# Patient Record
Sex: Female | Born: 1990 | Race: White | Hispanic: Yes | Marital: Married | Smoking: Former smoker
Health system: Southern US, Community
[De-identification: ages and names within clinical notes are randomized; demographics above are authoritative.]

## PROBLEM LIST (undated history)

## (undated) DIAGNOSIS — Z789 Other specified health status: Secondary | ICD-10-CM

## (undated) DIAGNOSIS — B9689 Other specified bacterial agents as the cause of diseases classified elsewhere: Secondary | ICD-10-CM

## (undated) DIAGNOSIS — N390 Urinary tract infection, site not specified: Secondary | ICD-10-CM

## (undated) DIAGNOSIS — R569 Unspecified convulsions: Secondary | ICD-10-CM

## (undated) DIAGNOSIS — F419 Anxiety disorder, unspecified: Secondary | ICD-10-CM

## (undated) DIAGNOSIS — F32A Depression, unspecified: Secondary | ICD-10-CM

## (undated) DIAGNOSIS — Z3480 Encounter for supervision of other normal pregnancy, unspecified trimester: Secondary | ICD-10-CM

## (undated) DIAGNOSIS — N760 Acute vaginitis: Secondary | ICD-10-CM

## (undated) HISTORY — DX: Other specified health status: Z78.9

## (undated) HISTORY — PX: OTHER SURGICAL HISTORY: SHX169

## (undated) HISTORY — PX: DILATION AND CURETTAGE, DIAGNOSTIC / THERAPEUTIC: SUR384

## (undated) HISTORY — DX: Encounter for supervision of other normal pregnancy, unspecified trimester: Z34.80

## (undated) HISTORY — DX: Depression, unspecified: F32.A

## (undated) HISTORY — PX: TUMOR REMOVAL: SHX12

## (undated) HISTORY — DX: Anxiety disorder, unspecified: F41.9

---
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
## 2011-02-17 LAB — TYPE AND SCREEN

## 2011-02-17 LAB — URINALYSIS, ROUTINE W REFLEX MICROSCOPIC
Bilirubin Urine: NEGATIVE
Ketones, ur: NEGATIVE
Nitrite: NEGATIVE
Protein, ur: NEGATIVE
pH: 6

## 2011-02-17 LAB — ABO/RH: ABO/RH(D): O POS

## 2011-04-15 ENCOUNTER — Emergency Department (HOSPITAL_COMMUNITY)
Admission: EM | Admit: 2011-04-15 | Discharge: 2011-04-15 | Disposition: A | Discharge status: Home / Self Care | Payer: Self-pay | Attending: Emergency Medicine | Admitting: Emergency Medicine

## 2011-04-15 ENCOUNTER — Encounter: Payer: Self-pay | Admitting: Emergency Medicine

## 2011-04-15 DIAGNOSIS — A499 Bacterial infection, unspecified: Secondary | ICD-10-CM | POA: Insufficient documentation

## 2011-04-15 DIAGNOSIS — N760 Acute vaginitis: Secondary | ICD-10-CM | POA: Insufficient documentation

## 2011-04-15 DIAGNOSIS — J45909 Unspecified asthma, uncomplicated: Secondary | ICD-10-CM | POA: Insufficient documentation

## 2011-04-15 DIAGNOSIS — F17200 Nicotine dependence, unspecified, uncomplicated: Secondary | ICD-10-CM | POA: Insufficient documentation

## 2011-04-15 DIAGNOSIS — R300 Dysuria: Secondary | ICD-10-CM | POA: Insufficient documentation

## 2011-04-15 DIAGNOSIS — B9689 Other specified bacterial agents as the cause of diseases classified elsewhere: Secondary | ICD-10-CM | POA: Insufficient documentation

## 2011-04-15 DIAGNOSIS — N72 Inflammatory disease of cervix uteri: Secondary | ICD-10-CM | POA: Insufficient documentation

## 2011-04-15 LAB — URINALYSIS, ROUTINE W REFLEX MICROSCOPIC
Bilirubin Urine: NEGATIVE
Glucose, UA: NEGATIVE mg/dL
Ketones, ur: 15 mg/dL — AB
Protein, ur: 30 mg/dL — AB
pH: 5.5 (ref 5.0–8.0)

## 2011-04-15 LAB — WET PREP, GENITAL
Trich, Wet Prep: NONE SEEN
Yeast Wet Prep HPF POC: NONE SEEN

## 2011-04-15 LAB — URINE MICROSCOPIC-ADD ON

## 2011-04-15 MED ORDER — CEFTRIAXONE SODIUM 250 MG IJ SOLR
250.0000 mg | Freq: Once | INTRAMUSCULAR | Status: AC
  Administered 2011-04-15: 250 mg via INTRAMUSCULAR
  Filled 2011-04-15: qty 250

## 2011-04-15 MED ORDER — AZITHROMYCIN 250 MG PO TABS
500.0000 mg | ORAL_TABLET | Freq: Once | ORAL | Status: AC
  Administered 2011-04-15: 500 mg via ORAL
  Filled 2011-04-15: qty 2

## 2011-04-15 MED ORDER — LIDOCAINE HCL (PF) 1 % IJ SOLN
INTRAMUSCULAR | Status: AC
  Administered 2011-04-15: 5 mL
  Filled 2011-04-15: qty 5

## 2011-04-15 MED ORDER — METRONIDAZOLE 500 MG PO TABS: 500.0000 mg | ORAL_TABLET | Freq: Two times a day (BID) | ORAL | Status: AC

## 2011-04-15 MED ORDER — TRAMADOL HCL 50 MG PO TABS
50.0000 mg | ORAL_TABLET | Freq: Four times a day (QID) | ORAL | Status: AC | PRN
Start: 2011-04-15 — End: 2011-04-25

## 2011-04-15 NOTE — ED Notes (Signed)
PT ambulated with a steady gait; VSS; no signs of distress; skin warm and dry; respirations even and unlabored; no questions.

## 2011-04-15 NOTE — ED Provider Notes (Signed)
History     CSN: [id number] Arrival date & time: 04/15/2011  2:30 AM   First MD Initiated Contact with Patient 04/15/11 [phone]      Chief Complaint  Patient presents with  . Vaginal Discharge    (Consider location/radiation/quality/duration/timing/severity/associated sxs/prior treatment) Patient is a 20 y.o. female presenting with vaginal discharge. The history is provided by the patient. No language interpreter was used.  Vaginal Discharge This is a new problem. The current episode started more than 2 days ago. The problem occurs constantly. The problem has not changed since onset.Pertinent negatives include no chest pain, no abdominal pain, no headaches and no shortness of breath. The symptoms are aggravated by nothing. The symptoms are relieved by nothing. She has tried nothing for the symptoms. The treatment provided no relief.    Past Medical History  Diagnosis Date  . Asthma     History reviewed. No pertinent past surgical history.  No family history on file.  History  Substance Use Topics  . Smoking status: Current Everyday Smoker  . Smokeless tobacco: Not on file  . Alcohol Use: No    OB History    Grav Para Term Preterm Abortions TAB SAB Ect Mult Living                  Review of Systems  Constitutional: Negative for activity change.  HENT: Negative for facial swelling.   Eyes: Negative for discharge.  Respiratory: Negative for apnea and shortness of breath.   Cardiovascular: Negative for chest pain.  Gastrointestinal: Negative for abdominal pain and abdominal distention.  Genitourinary: Positive for dysuria and vaginal discharge. Negative for vaginal bleeding.  Musculoskeletal: Negative.   Neurological: Negative for headaches.  Hematological: Negative.   Psychiatric/Behavioral: Negative.     Allergies  Review of patient's allergies indicates no known allergies.  Home Medications  No current outpatient prescriptions on file.  BP 112/72  Pulse 103   Temp(Src) 99.1 F (37.3 C) (Oral)  Resp 18  SpO2 99%  LMP 04/07/2011  Physical Exam  Constitutional: She is oriented to person, place, and time. She appears well-developed and well-nourished.  HENT:  Head: Normocephalic and atraumatic.  Eyes: EOM are normal. Pupils are equal, round, and reactive to light.  Neck: Normal range of motion. Neck supple. No tracheal deviation present.  Cardiovascular: Normal rate and regular rhythm.   Pulmonary/Chest: Effort normal and breath sounds normal. She has no wheezes. She has no rales.  Abdominal: Soft. Bowel sounds are normal. There is no tenderness. There is no rebound and no guarding.  Genitourinary: Cervix exhibits motion tenderness. Right adnexum displays no tenderness. Left adnexum displays tenderness.  Musculoskeletal: Normal range of motion.  Neurological: She is alert and oriented to person, place, and time.  Skin: Skin is warm and dry.  Psychiatric: Thought content normal.    ED Course  Procedures (including critical care time)  Labs Reviewed  WET PREP, GENITAL - Abnormal; Notable for the following:    Clue Cells, Wet Prep MODERATE (*)    WBC, Wet Prep HPF POC MODERATE (*)    All other components within normal limits  URINALYSIS, ROUTINE W REFLEX MICROSCOPIC - Abnormal; Notable for the following:    Specific Gravity, Urine 1.041 (*)    Hgb urine dipstick SMALL (*)    Ketones, ur 15 (*)    Protein, ur 30 (*)    Leukocytes, UA MODERATE (*)    All other components within normal limits  URINE MICROSCOPIC-ADD ON - Abnormal; Notable  for the following:    Squamous Epithelial / LPF FEW (*)    All other components within normal limits  POCT PREGNANCY, URINE  GC/CHLAMYDIA PROBE AMP, GENITAL  POCT PREGNANCY, URINE   No results found.   No diagnosis found.    MDM  No sexual activity until 7 days after your partner has been treated.  Take all medications.  Return for worsening symptoms.  Pap smear by your regular provider.   Recheck in 7 days.  Patient verbalizes understanding and agrees to follow up        Terrianne Cavness Smitty Cords, MD 04/15/11 0454

## 2011-04-15 NOTE — ED Notes (Signed)
PT. REPORTS VAGINAL DISCHARGE WITH ITCHING AND IRRITATION FOR 2 DAYS .

## 2012-01-26 ENCOUNTER — Emergency Department (HOSPITAL_COMMUNITY): Payer: Self-pay

## 2012-01-26 ENCOUNTER — Encounter (HOSPITAL_COMMUNITY): Payer: Self-pay

## 2012-01-26 ENCOUNTER — Emergency Department (HOSPITAL_COMMUNITY)
Admission: EM | Admit: 2012-01-26 | Discharge: 2012-01-27 | Disposition: A | Discharge status: Home / Self Care | Payer: Self-pay | Attending: Emergency Medicine | Admitting: Emergency Medicine

## 2012-01-26 DIAGNOSIS — N730 Acute parametritis and pelvic cellulitis: Secondary | ICD-10-CM | POA: Insufficient documentation

## 2012-01-26 HISTORY — DX: Other specified bacterial agents as the cause of diseases classified elsewhere: B96.89

## 2012-01-26 HISTORY — DX: Urinary tract infection, site not specified: N39.0

## 2012-01-26 HISTORY — DX: Other specified bacterial agents as the cause of diseases classified elsewhere: N76.0

## 2012-01-26 LAB — WET PREP, GENITAL
Trich, Wet Prep: NONE SEEN
Yeast Wet Prep HPF POC: NONE SEEN

## 2012-01-26 MED ORDER — IBUPROFEN 800 MG PO TABS
800.0000 mg | ORAL_TABLET | Freq: Once | ORAL | Status: AC
  Administered 2012-01-27: 800 mg via ORAL
  Filled 2012-01-26: qty 1

## 2012-01-26 MED ORDER — KETOROLAC TROMETHAMINE 60 MG/2ML IM SOLN
60.0000 mg | Freq: Once | INTRAMUSCULAR | Status: DC
  Filled 2012-01-26: qty 2

## 2012-01-26 NOTE — ED Notes (Signed)
PA at bedside for pelvic

## 2012-01-26 NOTE — ED Provider Notes (Signed)
History     CSN: [id number]  Arrival date & time 01/26/12  2110   First MD Initiated Contact with Patient 01/26/12 2223      Chief Complaint  Patient presents with  . Abdominal Pain   HPI  History provided by the patient. Patient is a 21 year old female who reports past history of BV, UTIs, ovarian cyst who presents with complaints of lower pelvic pain and discomfort for the past 2 months. Symptoms have been waxing and waning. Patient states are generally present everyday and do not recur monthly. There are times during the month they may become more severe. Pain does not increase during menstruation. Patient reports normal menstrual cycle on September 2 lasting 5 days. Denies any current vaginal bleeding. She doesn't have associated vaginal discharge is thick and white. She denies any unusual odor. She denies any dysuria, hematuria, urinary frequency or flank pain. She denies any fever, chills or sweats. Denies any nausea vomiting symptoms. Patient has one sexual partner. She does have unprotected sex. She denies prior history of STD.    Past Medical History  Diagnosis Date  . BV (bacterial vaginosis)   . UTI (lower urinary tract infection)     History reviewed. No pertinent past surgical history.  No family history on file.  History  Substance Use Topics  . Smoking status: Current Some Day Smoker  . Smokeless tobacco: Not on file  . Alcohol Use: Yes     social    OB History    Grav Para Term Preterm Abortions TAB SAB Ect Mult Living                  Review of Systems  Constitutional: Negative for fever, chills and appetite change.  Gastrointestinal: Positive for abdominal pain. Negative for nausea, vomiting, diarrhea and constipation.  Genitourinary: Positive for vaginal discharge and pelvic pain. Negative for dysuria, frequency, hematuria, flank pain and vaginal bleeding.  Skin: Negative for rash.    Allergies  Review of patient's allergies indicates no known  allergies.  Home Medications  No current outpatient prescriptions on file.  BP 114/66  Pulse 69  Resp 20  Ht 5\' 3"  (1.6 m)  Wt 132 lb (59.875 kg)  BMI 23.38 kg/m2  SpO2 100%  LMP 01/02/2012  Physical Exam  Nursing note and vitals reviewed. Constitutional: She is oriented to person, place, and time. She appears well-developed and well-nourished. No distress.  HENT:  Head: Normocephalic.  Cardiovascular: Normal rate and regular rhythm.   Pulmonary/Chest: Effort normal and breath sounds normal. No respiratory distress. She has no wheezes. She has no rales.  Abdominal: Soft. There is tenderness in the right lower quadrant and suprapubic area. There is no rigidity, no rebound, no guarding, no CVA tenderness, no tenderness at McBurney's point and negative Murphy's sign.  Genitourinary: Cervix exhibits motion tenderness and discharge. Right adnexum displays tenderness. Right adnexum displays no mass. Left adnexum displays tenderness. Left adnexum displays no mass.       Chaperone was present. Large amount of thick white discharge. Patient does have pain with cervical motion tenderness and palpation of both adnexa. No masses present.  Musculoskeletal: Normal range of motion. She exhibits no edema and no tenderness.  Neurological: She is alert and oriented to person, place, and time.  Skin: Skin is warm and dry. No rash noted.  Psychiatric: She has a normal mood and affect. Her behavior is normal.    ED Course  Procedures   Results for orders placed during  the hospital encounter of 01/26/12  WET PREP, GENITAL      Component Value Range   Yeast Wet Prep HPF POC NONE SEEN  NONE SEEN   Trich, Wet Prep NONE SEEN  NONE SEEN   Clue Cells Wet Prep HPF POC RARE (*) NONE SEEN   WBC, Wet Prep HPF POC MANY (*) NONE SEEN  URINALYSIS, ROUTINE W REFLEX MICROSCOPIC      Component Value Range   Color, Urine YELLOW  YELLOW   APPearance CLEAR  CLEAR   Specific Gravity, Urine 1.042 (*) 1.005 - 1.030    pH 7.5  5.0 - 8.0   Glucose, UA NEGATIVE  NEGATIVE mg/dL   Hgb urine dipstick NEGATIVE  NEGATIVE   Bilirubin Urine NEGATIVE  NEGATIVE   Ketones, ur NEGATIVE  NEGATIVE mg/dL   Protein, ur 30 (*) NEGATIVE mg/dL   Urobilinogen, UA 0.2  0.0 - 1.0 mg/dL   Nitrite NEGATIVE  NEGATIVE   Leukocytes, UA SMALL (*) NEGATIVE  POCT PREGNANCY, URINE      Component Value Range   Preg Test, Ur NEGATIVE  NEGATIVE  URINE MICROSCOPIC-ADD ON      Component Value Range   Squamous Epithelial / LPF FEW (*) RARE   WBC, UA 0-2  <3 WBC/hpf   Urine-Other MUCOUS PRESENT        US Transvaginal Non-ob  01/27/2012  *RADIOLOGY REPORT*  Clinical Data:  Abdominal pain.  Ovarian torsion.  TRANSABDOMINAL AND TRANSVAGINAL ULTRASOUND OF PELVIS DOPPLER ULTRASOUND OF OVARIES  Technique:  Both transabdominal and transvaginal ultrasound examinations of the pelvis were performed. Transabdominal technique was performed for global imaging of the pelvis including uterus, ovaries, adnexal regions, and pelvic cul-de-sac.  It was necessary to proceed with endovaginal exam following the transabdominal exam to visualize the uterus and adnexa.  Color and duplex Doppler ultrasound was utilized to evaluate blood flow to the ovaries.  Comparison:  None.  Findings:  Uterus:  Normal myometrial echotexture.  Uterus measures 9.1 cm x 4.2 cm x 5.8 cm.  Endometrium:  Thickened endometrium measuring 1.9 cm.  Right ovary: Normal appearance/no adnexal mass  Left ovary:   Normal appearance/no adnexal mass  Pulsed Doppler evaluation demonstrates normal low-resistance arterial and venous waveforms in both ovaries.  IMPRESSION: Endometrial thickening up to 19 mm.  If the patient has abnormal uterine bleeding, consider follow-up repeat scanning on day seven of cycle to reassess.  Normal appearance of the uterus and ovaries.  No sonographic evidence for ovarian torsion.   Original Report Authenticated By: Andreas Newport, M.D.    US Pelvis  Complete  01/27/2012  *RADIOLOGY REPORT*  Clinical Data:  Abdominal pain.  Ovarian torsion.  TRANSABDOMINAL AND TRANSVAGINAL ULTRASOUND OF PELVIS DOPPLER ULTRASOUND OF OVARIES  Technique:  Both transabdominal and transvaginal ultrasound examinations of the pelvis were performed. Transabdominal technique was performed for global imaging of the pelvis including uterus, ovaries, adnexal regions, and pelvic cul-de-sac.  It was necessary to proceed with endovaginal exam following the transabdominal exam to visualize the uterus and adnexa.  Color and duplex Doppler ultrasound was utilized to evaluate blood flow to the ovaries.  Comparison:  None.  Findings:  Uterus:  Normal myometrial echotexture.  Uterus measures 9.1 cm x 4.2 cm x 5.8 cm.  Endometrium:  Thickened endometrium measuring 1.9 cm.  Right ovary: Normal appearance/no adnexal mass  Left ovary:   Normal appearance/no adnexal mass  Pulsed Doppler evaluation demonstrates normal low-resistance arterial and venous waveforms in both ovaries.  IMPRESSION: Endometrial thickening  up to 19 mm.  If the patient has abnormal uterine bleeding, consider follow-up repeat scanning on day seven of cycle to reassess.  Normal appearance of the uterus and ovaries.  No sonographic evidence for ovarian torsion.   Original Report Authenticated By: Andreas Newport, M.D.    [place] Art/ven Flow Abd Pelv Doppler  01/27/2012  *RADIOLOGY REPORT*  Clinical Data:  Abdominal pain.  Ovarian torsion.  TRANSABDOMINAL AND TRANSVAGINAL ULTRASOUND OF PELVIS DOPPLER ULTRASOUND OF OVARIES  Technique:  Both transabdominal and transvaginal ultrasound examinations of the pelvis were performed. Transabdominal technique was performed for global imaging of the pelvis including uterus, ovaries, adnexal regions, and pelvic cul-de-sac.  It was necessary to proceed with endovaginal exam following the transabdominal exam to visualize the uterus and adnexa.  Color and duplex Doppler ultrasound was utilized to  evaluate blood flow to the ovaries.  Comparison:  None.  Findings:  Uterus:  Normal myometrial echotexture.  Uterus measures 9.1 cm x 4.2 cm x 5.8 cm.  Endometrium:  Thickened endometrium measuring 1.9 cm.  Right ovary: Normal appearance/no adnexal mass  Left ovary:   Normal appearance/no adnexal mass  Pulsed Doppler evaluation demonstrates normal low-resistance arterial and venous waveforms in both ovaries.  IMPRESSION: Endometrial thickening up to 19 mm.  If the patient has abnormal uterine bleeding, consider follow-up repeat scanning on day seven of cycle to reassess.  Normal appearance of the uterus and ovaries.  No sonographic evidence for ovarian torsion.   Original Report Authenticated By: Andreas Newport, M.D.      1. PID (acute pelvic inflammatory disease)       MDM  11:00PM patient seen and evaluated. Patient does not appear in any acute distress. Patient is new to the area. Reports prior history of right ovarian cyst she believes to be small less than 3 cm but does not recall exactly.  Pain today is somewhat similar.   Ultrasounds did not show any ovarian cyst. Patient does have tenderness on pelvic exam with CMT. She has numerous to count WBCs per pelvic exam. At this time we'll cover her for possible STDs prescribe doxycycline for PID. Patient instructed on diagnosis and followup instructions with OB/GYN or [facility].     Angus Seller, [place] 01/27/12 [phone]

## 2012-01-26 NOTE — ED Notes (Signed)
Pt in Ultrasound

## 2012-01-26 NOTE — ED Notes (Signed)
Pt states she just moved here from [place].  States for the past 2 months, she has been having lower abdominal and pelvic pain-states treated in August for UTI and states she has a hx bacterial vaginosis-states she has been having vaginal discharge.  States she also wants to be checked for "carpal tunnel"-states "I cry in the middle of the night because my right arm hurts"-pt has multiple complaints-no PCP yet since moved here.

## 2012-01-27 LAB — URINALYSIS, ROUTINE W REFLEX MICROSCOPIC
Bilirubin Urine: NEGATIVE
Glucose, UA: NEGATIVE mg/dL
Hgb urine dipstick: NEGATIVE
Ketones, ur: NEGATIVE mg/dL
Nitrite: NEGATIVE
Protein, ur: 30 mg/dL — AB
Specific Gravity, Urine: 1.042 — ABNORMAL HIGH (ref 1.005–1.030)
Urobilinogen, UA: 0.2 mg/dL (ref 0.0–1.0)
pH: 7.5 (ref 5.0–8.0)

## 2012-01-27 LAB — URINE MICROSCOPIC-ADD ON

## 2012-01-27 MED ORDER — METRONIDAZOLE 500 MG PO TABS
2000.0000 mg | ORAL_TABLET | Freq: Once | ORAL | Status: AC
  Administered 2012-01-27: 2000 mg via ORAL
  Filled 2012-01-27: qty 4

## 2012-01-27 MED ORDER — CEFTRIAXONE SODIUM 250 MG IJ SOLR
250.0000 mg | Freq: Once | INTRAMUSCULAR | Status: AC
  Administered 2012-01-27: 250 mg via INTRAMUSCULAR
  Filled 2012-01-27: qty 250

## 2012-01-27 MED ORDER — DOXYCYCLINE HYCLATE 100 MG PO CAPS: 100.0000 mg | ORAL_CAPSULE | Freq: Two times a day (BID) | ORAL | Status: DC

## 2012-01-27 MED ORDER — NAPROXEN 500 MG PO TABS: 500.0000 mg | ORAL_TABLET | Freq: Two times a day (BID) | ORAL | Status: DC

## 2012-01-27 MED ORDER — LIDOCAINE HCL 1 % IJ SOLN
INTRAMUSCULAR | Status: AC
  Filled 2012-01-27: qty 20

## 2012-01-27 MED ORDER — AZITHROMYCIN 250 MG PO TABS
1000.0000 mg | ORAL_TABLET | Freq: Once | ORAL | Status: AC
  Administered 2012-01-27: 1000 mg via ORAL
  Filled 2012-01-27: qty 4

## 2012-01-27 NOTE — ED Provider Notes (Signed)
Medical screening examination/treatment/procedure(s) were performed by non-physician practitioner and as supervising physician I was immediately available for consultation/collaboration.  Raeford Razor, MD 01/27/12 1212

## 2012-01-28 LAB — GC/CHLAMYDIA PROBE AMP, GENITAL
Chlamydia, DNA Probe: NEGATIVE
GC Probe Amp, Genital: NEGATIVE

## 2012-05-02 ENCOUNTER — Emergency Department (HOSPITAL_BASED_OUTPATIENT_CLINIC_OR_DEPARTMENT_OTHER)
Admission: EM | Admit: 2012-05-02 | Discharge: 2012-05-03 | Disposition: A | Discharge status: Home / Self Care | Payer: Managed Care, Other (non HMO) | Attending: Emergency Medicine | Admitting: Emergency Medicine

## 2012-05-02 ENCOUNTER — Encounter (HOSPITAL_BASED_OUTPATIENT_CLINIC_OR_DEPARTMENT_OTHER): Payer: Self-pay | Admitting: *Deleted

## 2012-05-02 DIAGNOSIS — J1189 Influenza due to unidentified influenza virus with other manifestations: Secondary | ICD-10-CM | POA: Insufficient documentation

## 2012-05-02 DIAGNOSIS — J45909 Unspecified asthma, uncomplicated: Secondary | ICD-10-CM | POA: Insufficient documentation

## 2012-05-02 DIAGNOSIS — F17200 Nicotine dependence, unspecified, uncomplicated: Secondary | ICD-10-CM | POA: Insufficient documentation

## 2012-05-02 DIAGNOSIS — R059 Cough, unspecified: Secondary | ICD-10-CM | POA: Insufficient documentation

## 2012-05-02 DIAGNOSIS — R05 Cough: Secondary | ICD-10-CM | POA: Insufficient documentation

## 2012-05-02 DIAGNOSIS — Z3202 Encounter for pregnancy test, result negative: Secondary | ICD-10-CM | POA: Insufficient documentation

## 2012-05-02 DIAGNOSIS — N390 Urinary tract infection, site not specified: Secondary | ICD-10-CM

## 2012-05-02 DIAGNOSIS — R52 Pain, unspecified: Secondary | ICD-10-CM | POA: Insufficient documentation

## 2012-05-02 DIAGNOSIS — R6889 Other general symptoms and signs: Secondary | ICD-10-CM

## 2012-05-02 MED ORDER — SODIUM CHLORIDE 0.9 % IV BOLUS (SEPSIS)
1000.0000 mL | Freq: Once | INTRAVENOUS | Status: AC
  Administered 2012-05-02: 1000 mL via INTRAVENOUS

## 2012-05-02 NOTE — ED Notes (Signed)
Sore throat, fever chills, body aches since yesterday. Dizzy at triage.

## 2012-05-03 LAB — URINE MICROSCOPIC-ADD ON

## 2012-05-03 LAB — RAPID STREP SCREEN (MED CTR MEBANE ONLY): Streptococcus, Group A Screen (Direct): NEGATIVE

## 2012-05-03 LAB — URINALYSIS, ROUTINE W REFLEX MICROSCOPIC
Ketones, ur: >80 mg/dL — AB
Nitrite: NEGATIVE
Protein, ur: NEGATIVE mg/dL
Urobilinogen, UA: 0.2 mg/dL (ref 0.0–1.0)

## 2012-05-03 LAB — PREGNANCY, URINE: Preg Test, Ur: NEGATIVE

## 2012-05-03 MED ORDER — CIPROFLOXACIN HCL 500 MG PO TABS: 500.0000 mg | ORAL_TABLET | Freq: Two times a day (BID) | ORAL | Status: DC

## 2012-05-03 MED ORDER — OSELTAMIVIR PHOSPHATE 75 MG PO CAPS: 75.0000 mg | ORAL_CAPSULE | Freq: Two times a day (BID) | ORAL | Status: DC

## 2012-05-03 NOTE — ED Provider Notes (Signed)
History     CSN: [id number]  Arrival date & time 05/02/12  2231   First MD Initiated Contact with Patient 05/03/12 0119      Chief Complaint  Patient presents with  . Generalized Body Aches    (Consider location/radiation/quality/duration/timing/severity/associated sxs/prior treatment) Patient is a 22 y.o. female presenting with flu symptoms. The history is provided by the patient.  Influenza This is a new problem. The current episode started yesterday. The problem occurs constantly. The problem has not changed since onset.Pertinent negatives include no chest pain, no abdominal pain, no headaches and no shortness of breath. Nothing aggravates the symptoms. Nothing relieves the symptoms. She has tried nothing for the symptoms. The treatment provided no relief.  Urine also reportedly had an odor and was sent.  No flu vaccine this year  Past Medical History  Diagnosis Date  . Asthma     History reviewed. No pertinent past surgical history.  History reviewed. No pertinent family history.  History  Substance Use Topics  . Smoking status: Current Every Day Smoker  . Smokeless tobacco: Not on file  . Alcohol Use: No    OB History    Grav Para Term Preterm Abortions TAB SAB Ect Mult Living                  Review of Systems  Constitutional: Positive for fever.  HENT: Negative for neck pain and neck stiffness.   Respiratory: Positive for cough. Negative for shortness of breath.   Cardiovascular: Negative for chest pain.  Gastrointestinal: Negative for abdominal pain.  Musculoskeletal: Positive for myalgias.  Neurological: Negative for headaches.  All other systems reviewed and are negative.    Allergies  Review of patient's allergies indicates no known allergies.  Home Medications  No current outpatient prescriptions on file.  BP 100/69  Pulse 102  Temp 101 F (38.3 C) (Oral)  Resp 18  Ht 5\' 3"  (1.6 m)  Wt 127 lb (57.607 kg)  BMI 22.50 kg/m2  SpO2 98%  LMP  04/02/2012  Physical Exam  Constitutional: She is oriented to person, place, and time. She appears well-developed and well-nourished. No distress.  HENT:  Head: Normocephalic and atraumatic.  Right Ear: External ear normal.  Left Ear: External ear normal.  Mouth/Throat: Oropharynx is clear and moist.  Eyes: Conjunctivae normal are normal. Pupils are equal, round, and reactive to light.  Neck: Normal range of motion. Neck supple.  Cardiovascular: Normal rate, regular rhythm and intact distal pulses.   Pulmonary/Chest: Effort normal and breath sounds normal. She has no wheezes. She has no rales.  Abdominal: Soft. Bowel sounds are normal. There is no tenderness. There is no rebound and no guarding.  Musculoskeletal: Normal range of motion.  Lymphadenopathy:    She has no cervical adenopathy.  Neurological: She is alert and oriented to person, place, and time.  Skin: Skin is warm and dry.  Psychiatric: She has a normal mood and affect.    ED Course  Procedures (including critical care time)   Labs Reviewed  PREGNANCY, URINE  RAPID STREP SCREEN  URINALYSIS, ROUTINE W REFLEX MICROSCOPIC   No results found.   No diagnosis found.    MDM  Will treat for influenza and UTI.  Return for worsening symptoms.  Follow up with your family doctor for recheck       Nehal Witting K Dillian Feig-Rasch, MD 05/03/12 [phone]

## 2012-05-04 LAB — URINE CULTURE: Colony Count: 50000

## 2012-09-11 ENCOUNTER — Encounter (HOSPITAL_COMMUNITY): Payer: Self-pay | Admitting: Emergency Medicine

## 2012-09-11 ENCOUNTER — Emergency Department (HOSPITAL_COMMUNITY)
Admission: EM | Admit: 2012-09-11 | Discharge: 2012-09-12 | Disposition: A | Discharge status: Home / Self Care | Payer: Self-pay | Attending: Emergency Medicine | Admitting: Emergency Medicine

## 2012-09-11 DIAGNOSIS — IMO0001 Reserved for inherently not codable concepts without codable children: Secondary | ICD-10-CM | POA: Insufficient documentation

## 2012-09-11 DIAGNOSIS — R634 Abnormal weight loss: Secondary | ICD-10-CM | POA: Insufficient documentation

## 2012-09-11 DIAGNOSIS — R5383 Other fatigue: Secondary | ICD-10-CM | POA: Insufficient documentation

## 2012-09-11 DIAGNOSIS — F17200 Nicotine dependence, unspecified, uncomplicated: Secondary | ICD-10-CM | POA: Insufficient documentation

## 2012-09-11 DIAGNOSIS — E860 Dehydration: Secondary | ICD-10-CM | POA: Insufficient documentation

## 2012-09-11 DIAGNOSIS — R1110 Vomiting, unspecified: Secondary | ICD-10-CM

## 2012-09-11 DIAGNOSIS — J45909 Unspecified asthma, uncomplicated: Secondary | ICD-10-CM | POA: Insufficient documentation

## 2012-09-11 DIAGNOSIS — R5381 Other malaise: Secondary | ICD-10-CM | POA: Insufficient documentation

## 2012-09-11 DIAGNOSIS — R112 Nausea with vomiting, unspecified: Secondary | ICD-10-CM | POA: Insufficient documentation

## 2012-09-11 MED ORDER — SODIUM CHLORIDE 0.9 % IV BOLUS (SEPSIS)
2000.0000 mL | Freq: Once | INTRAVENOUS | Status: AC
  Administered 2012-09-12: 2000 mL via INTRAVENOUS

## 2012-09-11 MED ORDER — KETOROLAC TROMETHAMINE 30 MG/ML IJ SOLN
30.0000 mg | Freq: Once | INTRAMUSCULAR | Status: DC
  Filled 2012-09-11: qty 1

## 2012-09-11 MED ORDER — ONDANSETRON HCL 4 MG/2ML IJ SOLN
4.0000 mg | Freq: Once | INTRAMUSCULAR | Status: AC
  Administered 2012-09-12: 4 mg via INTRAVENOUS
  Filled 2012-09-11: qty 2

## 2012-09-11 NOTE — ED Notes (Signed)
Pt states she has been sick for a week  Pt states when she eats she has vomiting  Pt states she has lost about 9 lbs in a weeks time  Pt states her joints ache and she has had swelling in her arms

## 2012-09-11 NOTE — ED Notes (Signed)
Pt was unable to urinate at this time will try again later

## 2012-09-12 LAB — CBC WITH DIFFERENTIAL/PLATELET
Basophils Absolute: 0.1 10*3/uL (ref 0.0–0.1)
Basophils Relative: 1 % (ref 0–1)
Eosinophils Absolute: 0.2 10*3/uL (ref 0.0–0.7)
Eosinophils Relative: 2 % (ref 0–5)
HCT: 37.6 % (ref 36.0–46.0)
Hemoglobin: 12.1 g/dL (ref 12.0–15.0)
Lymphocytes Relative: 26 % (ref 12–46)
Lymphs Abs: 2.6 10*3/uL (ref 0.7–4.0)
MCH: 27.1 pg (ref 26.0–34.0)
MCHC: 32.2 g/dL (ref 30.0–36.0)
MCV: 84.1 fL (ref 78.0–100.0)
Monocytes Absolute: 0.9 10*3/uL (ref 0.1–1.0)
Monocytes Relative: 9 % (ref 3–12)
Neutro Abs: 6.4 10*3/uL (ref 1.7–7.7)
Neutrophils Relative %: 63 % (ref 43–77)
Platelets: 368 10*3/uL (ref 150–400)
RBC: 4.47 MIL/uL (ref 3.87–5.11)
RDW: 12.1 % (ref 11.5–15.5)
WBC: 10.1 10*3/uL (ref 4.0–10.5)

## 2012-09-12 LAB — POCT PREGNANCY, URINE: Preg Test, Ur: NEGATIVE

## 2012-09-12 LAB — URINALYSIS, ROUTINE W REFLEX MICROSCOPIC
Bilirubin Urine: NEGATIVE
Ketones, ur: NEGATIVE mg/dL
Nitrite: NEGATIVE
Protein, ur: NEGATIVE mg/dL

## 2012-09-12 LAB — COMPREHENSIVE METABOLIC PANEL
ALT: 8 U/L (ref 0–35)
AST: 14 U/L (ref 0–37)
Albumin: 3.8 g/dL (ref 3.5–5.2)
Alkaline Phosphatase: 68 U/L (ref 39–117)
BUN: 11 mg/dL (ref 6–23)
CO2: 27 mEq/L (ref 19–32)
Calcium: 9.9 mg/dL (ref 8.4–10.5)
Chloride: 104 mEq/L (ref 96–112)
Creatinine, Ser: 0.75 mg/dL (ref 0.50–1.10)
GFR calc Af Amer: >90 mL/min (ref >90)
GFR calc non Af Amer: >90 mL/min (ref >90)
Glucose, Bld: 71 mg/dL (ref 70–99)
Potassium: 3.6 mEq/L (ref 3.5–5.1)
Sodium: 140 mEq/L (ref 135–145)
Total Bilirubin: 0.4 mg/dL (ref 0.3–1.2)
Total Protein: 7.3 g/dL (ref 6.0–8.3)

## 2012-09-12 LAB — URINE MICROSCOPIC-ADD ON

## 2012-09-12 LAB — LIPASE, BLOOD: Lipase: 26 U/L (ref 11–59)

## 2012-09-12 MED ORDER — PROMETHAZINE HCL 25 MG PO TABS: 25.0000 mg | ORAL_TABLET | Freq: Four times a day (QID) | ORAL | Status: DC | PRN

## 2012-09-12 NOTE — ED Notes (Signed)
Urinalysis and POCT Preg completed during downtime on paper charting.

## 2012-09-12 NOTE — ED Provider Notes (Signed)
Medical screening examination/treatment/procedure(s) were performed by non-physician practitioner and as supervising physician I was immediately available for consultation/collaboration.  Sunnie Nielsen, MD 09/12/12 [phone]

## 2012-09-12 NOTE — ED Provider Notes (Signed)
History     CSN: [id number]  Arrival date & time 09/11/12  2125   First MD Initiated Contact with Patient 09/11/12 2238      Chief Complaint  Patient presents with  . Weight Loss  . Emesis    (Consider location/radiation/quality/duration/timing/severity/associated sxs/prior treatment) HPI She presents emergency department with weakness, nausea, and vomiting, and muscle aches for the last week.  Patient, states, that she's had some symptoms over the last few months.  This got worse over the last week.  Patient denies chest pain, shortness of breath, abdominal pain, blurred vision, headache, numbness, dysuria, vaginal discharge, or vaginal bleeding.  Patient, states, that she has not taken anything for her symptoms other than ibuprofen Past Medical History  Diagnosis Date  . Asthma     History reviewed. No pertinent past surgical history.  Family History  Problem Relation Age of Onset  . Lupus Sister   . Lupus Other   . Hypertension Other   . Diabetes Other     History  Substance Use Topics  . Smoking status: Current Every Day Smoker  . Smokeless tobacco: Not on file  . Alcohol Use: No    OB History   Grav Para Term Preterm Abortions TAB SAB Ect Mult Living                  Review of Systems All other systems negative except as documented in the HPI. All pertinent positives and negatives as reviewed in the HPI. Allergies  Review of patient's allergies indicates no known allergies.  Home Medications   Current Outpatient Rx  Name  Route  Sig  Dispense  Refill  . ibuprofen (ADVIL,MOTRIN) 200 MG tablet   Oral   Take 600 mg by mouth every 6 (six) hours as needed for pain (pain).           BP 110/67  Pulse 76  Temp(Src) 97.8 F (36.6 C) (Oral)  Resp 18  Wt 121 lb (54.885 kg)  BMI 21.44 kg/m2  SpO2 99%  LMP 06/16/2012  Physical Exam  Constitutional: She is oriented to person, place, and time. She appears well-developed and well-nourished. No distress.   HENT:  Head: Normocephalic and atraumatic.  Mouth/Throat: Oropharynx is clear and moist.  Eyes: Pupils are equal, round, and reactive to light.  Neck: Normal range of motion. Neck supple.  Cardiovascular: Normal rate, regular rhythm and normal heart sounds.   Pulmonary/Chest: Effort normal and breath sounds normal.  Abdominal: Soft. Bowel sounds are normal. She exhibits no distension. There is no tenderness.  Neurological: She is alert and oriented to person, place, and time. She exhibits normal muscle tone. Coordination normal.  Skin: Skin is warm and dry. No rash noted.    ED Course  Procedures (including critical care time)  Labs Reviewed  CBC WITH DIFFERENTIAL  COMPREHENSIVE METABOLIC PANEL  LIPASE, BLOOD  URINALYSIS, ROUTINE W REFLEX MICROSCOPIC   Patient be worked up for common issues and given IV fluids.  Patient is stable.  Has no neurological deficits noted on exam.  MDM  MDM Reviewed: vitals and nursing note Interpretation: labs            Carlyle Dolly, PA-C 09/12/12 0025

## 2012-09-12 NOTE — ED Provider Notes (Signed)
Pt received from [place], PA-C.  Pt presented to ED w/ right-sided abd pain.  CT shows what appears to be colitis.  Will treat symptomatically because it appears to be mild and pt young/healthy.  She did receive IM rocephin and po zithromax to cover her for possible cervicitis as well.   Prescribed 12 vicodin for pain.  Return precautions discussed.   Arie Sabina Anselm Aumiller, PA-C 09/12/12 1220

## 2012-09-13 NOTE — ED Provider Notes (Signed)
Medical screening examination/treatment/procedure(s) were performed by non-physician practitioner and as supervising physician I was immediately available for consultation/collaboration.  Sunnie Nielsen, MD 09/13/12 [phone]

## 2014-09-08 ENCOUNTER — Emergency Department
Admission: EM | Admit: 2014-09-08 | Discharge: 2014-09-08 | Discharge status: Against Medical Advice | Payer: PRIVATE HEALTH INSURANCE | Attending: Emergency Medicine | Admitting: Emergency Medicine

## 2014-09-08 DIAGNOSIS — J45909 Unspecified asthma, uncomplicated: Secondary | ICD-10-CM | POA: Diagnosis not present

## 2014-09-08 DIAGNOSIS — Z720 Tobacco use: Secondary | ICD-10-CM | POA: Diagnosis not present

## 2014-09-08 DIAGNOSIS — R1030 Lower abdominal pain, unspecified: Secondary | ICD-10-CM | POA: Diagnosis not present

## 2014-09-08 LAB — BASIC METABOLIC PANEL
Anion gap: 7 (ref 5–15)
BUN: 13 mg/dL (ref 6–20)
CHLORIDE: 106 mmol/L (ref 101–111)
CO2: 26 mmol/L (ref 22–32)
Calcium: 9.3 mg/dL (ref 8.9–10.3)
Creatinine, Ser: 0.69 mg/dL (ref 0.44–1.00)
GLUCOSE: 89 mg/dL (ref 65–99)
POTASSIUM: 3.5 mmol/L (ref 3.5–5.1)
SODIUM: 139 mmol/L (ref 135–145)

## 2014-09-08 LAB — CBC WITH DIFFERENTIAL/PLATELET
BASOS ABS: 0.1 10*3/uL (ref 0–0.1)
Basophils Relative: 1 %
Eosinophils Absolute: 0.2 10*3/uL (ref 0–0.7)
Eosinophils Relative: 2 %
HCT: 36.1 % (ref 35.0–47.0)
HEMOGLOBIN: 11.8 g/dL — AB (ref 12.0–16.0)
LYMPHS PCT: 35 %
Lymphs Abs: 3.3 10*3/uL (ref 1.0–3.6)
MCH: 27.8 pg (ref 26.0–34.0)
MCHC: 32.8 g/dL (ref 32.0–36.0)
MCV: 84.7 fL (ref 80.0–100.0)
MONO ABS: 0.7 10*3/uL (ref 0.2–0.9)
Monocytes Relative: 8 %
NEUTROS ABS: 5 10*3/uL (ref 1.4–6.5)
Neutrophils Relative %: 54 %
PLATELETS: 347 10*3/uL (ref 150–440)
RBC: 4.27 MIL/uL (ref 3.80–5.20)
RDW: 13 % (ref 11.5–14.5)
WBC: 9.3 10*3/uL (ref 3.6–11.0)

## 2014-09-08 NOTE — ED Notes (Signed)
Pt with lower abd pain x 2 days. Pt states she has history of ectopic preg. Pt has had std in the past. Pt is sexually active using protection.

## 2014-09-08 NOTE — ED Notes (Signed)
Patient presents to the desk to inquire about wait time. Patient states, "If I am not going back now then I am going to leave. [facility] RN checked status board and informed patient that there were several people in front of her; the longest being 2 hours, however this did not mean that she would be out here that long. Patient states, "[facility] I would rather go there anyway." Patient advised that she was leaving the ED at this time. Charge nurse made aware and patient eloped from the status board.

## 2014-11-26 DIAGNOSIS — F411 Generalized anxiety disorder: Secondary | ICD-10-CM | POA: Insufficient documentation

## 2017-02-09 ENCOUNTER — Encounter: Payer: Self-pay | Admitting: Emergency Medicine

## 2017-02-09 ENCOUNTER — Emergency Department: Payer: PRIVATE HEALTH INSURANCE

## 2017-02-09 ENCOUNTER — Emergency Department
Admission: EM | Admit: 2017-02-09 | Discharge: 2017-02-09 | Disposition: A | Discharge status: Home / Self Care | Payer: PRIVATE HEALTH INSURANCE | Attending: Emergency Medicine | Admitting: Emergency Medicine

## 2017-02-09 DIAGNOSIS — J069 Acute upper respiratory infection, unspecified: Secondary | ICD-10-CM | POA: Insufficient documentation

## 2017-02-09 DIAGNOSIS — B349 Viral infection, unspecified: Secondary | ICD-10-CM | POA: Diagnosis not present

## 2017-02-09 DIAGNOSIS — R05 Cough: Secondary | ICD-10-CM | POA: Diagnosis present

## 2017-02-09 DIAGNOSIS — B9789 Other viral agents as the cause of diseases classified elsewhere: Secondary | ICD-10-CM

## 2017-02-09 DIAGNOSIS — F17200 Nicotine dependence, unspecified, uncomplicated: Secondary | ICD-10-CM | POA: Insufficient documentation

## 2017-02-09 DIAGNOSIS — J4521 Mild intermittent asthma with (acute) exacerbation: Secondary | ICD-10-CM | POA: Insufficient documentation

## 2017-02-09 LAB — COMPREHENSIVE METABOLIC PANEL
ALBUMIN: 4 g/dL (ref 3.5–5.0)
ALK PHOS: 57 U/L (ref 38–126)
ALT: 14 U/L (ref 14–54)
AST: 19 U/L (ref 15–41)
Anion gap: 9 (ref 5–15)
BILIRUBIN TOTAL: 0.4 mg/dL (ref 0.3–1.2)
BUN: 8 mg/dL (ref 6–20)
CALCIUM: 9.3 mg/dL (ref 8.9–10.3)
CO2: 24 mmol/L (ref 22–32)
CREATININE: 0.75 mg/dL (ref 0.44–1.00)
Chloride: 104 mmol/L (ref 101–111)
GFR calc Af Amer: >60 mL/min (ref >60)
GFR calc non Af Amer: >60 mL/min (ref >60)
GLUCOSE: 88 mg/dL (ref 65–99)
Potassium: 3.8 mmol/L (ref 3.5–5.1)
SODIUM: 137 mmol/L (ref 135–145)
TOTAL PROTEIN: 7.4 g/dL (ref 6.5–8.1)

## 2017-02-09 LAB — URINALYSIS, COMPLETE (UACMP) WITH MICROSCOPIC
BILIRUBIN URINE: NEGATIVE
Bacteria, UA: NONE SEEN
GLUCOSE, UA: NEGATIVE mg/dL
Ketones, ur: NEGATIVE mg/dL
Leukocytes, UA: NEGATIVE
NITRITE: NEGATIVE
PH: 7 (ref 5.0–8.0)
Protein, ur: NEGATIVE mg/dL
Specific Gravity, Urine: 1.013 (ref 1.005–1.030)

## 2017-02-09 LAB — CBC WITH DIFFERENTIAL/PLATELET
BASOS ABS: 0.1 10*3/uL (ref 0–0.1)
BASOS PCT: 1 %
EOS ABS: 0.1 10*3/uL (ref 0–0.7)
Eosinophils Relative: 1 %
HCT: 37.5 % (ref 35.0–47.0)
Hemoglobin: 12.5 g/dL (ref 12.0–16.0)
Lymphocytes Relative: 6 %
Lymphs Abs: 0.7 10*3/uL — ABNORMAL LOW (ref 1.0–3.6)
MCH: 28.5 pg (ref 26.0–34.0)
MCHC: 33.4 g/dL (ref 32.0–36.0)
MCV: 85.2 fL (ref 80.0–100.0)
MONO ABS: 1 10*3/uL — AB (ref 0.2–0.9)
MONOS PCT: 8 %
Neutro Abs: 10.2 10*3/uL — ABNORMAL HIGH (ref 1.4–6.5)
Neutrophils Relative %: 84 %
PLATELETS: 360 10*3/uL (ref 150–440)
RBC: 4.4 MIL/uL (ref 3.80–5.20)
RDW: 13 % (ref 11.5–14.5)
WBC: 12.1 10*3/uL — ABNORMAL HIGH (ref 3.6–11.0)

## 2017-02-09 LAB — LACTIC ACID, PLASMA: Lactic Acid, Venous: 1.3 mmol/L (ref 0.5–1.9)

## 2017-02-09 LAB — INFLUENZA PANEL BY PCR (TYPE A & B)
INFLAPCR: NEGATIVE
INFLBPCR: NEGATIVE

## 2017-02-09 MED ORDER — PREDNISONE 20 MG PO TABS
60.0000 mg | ORAL_TABLET | Freq: Once | ORAL | Status: AC
  Administered 2017-02-09: 60 mg via ORAL
  Filled 2017-02-09: qty 3

## 2017-02-09 MED ORDER — PREDNISONE 20 MG PO TABS: 60.0000 mg | ORAL_TABLET | Freq: Every day | ORAL | 0 refills | Status: AC

## 2017-02-09 MED ORDER — IPRATROPIUM-ALBUTEROL 0.5-2.5 (3) MG/3ML IN SOLN
3.0000 mL | Freq: Once | RESPIRATORY_TRACT | Status: AC
  Administered 2017-02-09: 3 mL via RESPIRATORY_TRACT
  Filled 2017-02-09 (×2): qty 3

## 2017-02-09 MED ORDER — ALBUTEROL SULFATE HFA 108 (90 BASE) MCG/ACT IN AERS: 2.0000 | INHALATION_SPRAY | Freq: Four times a day (QID) | RESPIRATORY_TRACT | 2 refills | Status: DC | PRN

## 2017-02-09 NOTE — ED Notes (Signed)
Pt ambulatory at discharge. Pt states feeling "hot all over" but temp is only 99.4. Pt stated she will "just go home and take some tylenol." Pt verbalized understanding of discharge instructions and prescriptions. VSS. Skin warm and dry.

## 2017-02-09 NOTE — ED Provider Notes (Signed)
[facility] Emergency Department Provider Note  ____________________________________________  Time seen: Approximately 6:30 PM  I have reviewed the triage vital signs and the nursing notes.   HISTORY  Chief Complaint Cough   HPI GENTRI GUARDADO is a 26 y.o. female with a history of asthmawho presents for evaluation of flulike symptoms. Patient reports 2 days of cough productive of yellow sputum, sore throat, body aches, chills, and fever as high as 103F this morning at 4 AM. She took over-the-counter flu and cold medicine before coming into the emergency room. She is also complaining of mild shortness of breath that has progressed throughout the day. She ran out of her albuterol inhaler. She denies nausea, vomiting, diarrhea, headache, neck stiffness, dysuria or hematuria, abdominal pain. Patient describes her shortness of breath as mild to moderate in intensity and associated with intermittent wheezing.  PMH Asthma ADHD  History reviewed. No pertinent surgical history.  Prior to Admission medications   Medication Sig Start Date End Date Taking? Authorizing Provider  albuterol (PROVENTIL HFA;VENTOLIN HFA) 108 (90 Base) MCG/ACT inhaler Inhale 2 puffs into the lungs every 6 (six) hours as needed for wheezing or shortness of breath. 02/09/17   Nita Sickle, MD  ibuprofen (ADVIL,MOTRIN) 200 MG tablet Take 600 mg by mouth every 6 (six) hours as needed for pain (pain).    [provider]  predniSONE (DELTASONE) 20 MG tablet Take 3 tablets (60 mg total) by mouth daily. 02/09/17 02/13/17  Nita Sickle, MD  promethazine (PHENERGAN) 25 MG tablet Take 1 tablet (25 mg total) by mouth every 6 (six) hours as needed for nausea. 09/12/12   Schinlever, Santina Evans, PA-C    Allergies Patient has no known allergies.  Family History  Problem Relation Age of Onset  . Lupus Sister   . Lupus Other   . Hypertension Other   . Diabetes Other     Social  History Social History  Substance Use Topics  . Smoking status: Current Every Day Smoker  . Smokeless tobacco: Never Used  . Alcohol use No    Review of Systems  Constitutional: + fever, chills, body aches Eyes: Negative for visual changes. ENT: + sore throat. Neck: No neck pain  Cardiovascular: Negative for chest pain. Respiratory: + shortness of breath, cough Gastrointestinal: Negative for abdominal pain, vomiting or diarrhea. Genitourinary: Negative for dysuria. Musculoskeletal: Negative for back pain. Skin: Negative for rash. Neurological: Negative for headaches, weakness or numbness. Psych: No SI or HI  ____________________________________________   PHYSICAL EXAM:  VITAL SIGNS: ED Triage Vitals  Enc Vitals Group     BP 02/09/17 1658 133/66     Pulse Rate 02/09/17 1658 100     Resp 02/09/17 1658 (!) 34     Temp 02/09/17 1658 98.9 F (37.2 C)     Temp Source 02/09/17 1658 Oral     SpO2 02/09/17 1658 100 %     Weight 02/09/17 1659 141 lb (64 kg)     Height 02/09/17 1659  (1.6 m)     Head Circumference --      Peak Flow --      Pain Score --      Pain Loc --      Pain Edu? --      Excl. in GC? --     Constitutional: Alert and oriented. Well appearing and in no apparent distress. HEENT:      Head: Normocephalic and atraumatic.         Eyes:  Conjunctivae are normal. Sclera is non-icteric.       Mouth/Throat: Mucous membranes are moist. oropharynx is clear with no exudates or erythema      Neck: Supple with no signs of meningismus. Cardiovascular: tachycardic with regular rhythm. No murmurs, gallops, or rubs. 2+ symmetrical distal pulses are present in all extremities. No JVD. Respiratory: slightly increased work of breathing, tachypnea, satting well on room air, significantly diminished air movement bilaterally with no wheezing or crackles Gastrointestinal: Soft, non tender, and non distended with positive bowel sounds. No rebound or  guarding. Genitourinary: No CVA tenderness. Musculoskeletal: Nontender with normal range of motion in all extremities. No edema, cyanosis, or erythema of extremities. Neurologic: Normal speech and language. Face is symmetric. Moving all extremities. No gross focal neurologic deficits are appreciated. Skin: Skin is warm, dry and intact. No rash noted. Psychiatric: Mood and affect are normal. Speech and behavior are normal.  ____________________________________________   LABS (all labs ordered are listed, but only abnormal results are displayed)  Labs Reviewed  CBC WITH DIFFERENTIAL/PLATELET - Abnormal; Notable for the following:       Result Value   WBC 12.1 (*)    Neutro Abs 10.2 (*)    Lymphs Abs 0.7 (*)    Monocytes Absolute 1.0 (*)    All other components within normal limits  URINALYSIS, COMPLETE (UACMP) WITH MICROSCOPIC - Abnormal; Notable for the following:    Color, Urine YELLOW (*)    APPearance CLEAR (*)    Hgb urine dipstick MODERATE (*)    Squamous Epithelial / LPF 0-5 (*)    All other components within normal limits  LACTIC ACID, PLASMA  COMPREHENSIVE METABOLIC PANEL  INFLUENZA PANEL BY PCR (TYPE A & B)  LACTIC ACID, PLASMA   ____________________________________________  EKG  none  ____________________________________________  RADIOLOGY  CXR: Negative  ____________________________________________   PROCEDURES  Procedure(s) performed: None Procedures Critical Care performed:  None ____________________________________________   INITIAL IMPRESSION / ASSESSMENT AND PLAN / ED COURSE  26 y.o. female with a history of asthmawho presents for evaluation of flulike symptoms since yesterday. chest x-ray with no evidence of pneumonia. Patient with a mild asthma exacerbation. We'll treat with duoneb and prednisone. Flu swab is pending.labs show a slight leukocytosis consistent with a viral URI.    ----------------------------------------- 5:40 PM on  02/09/2017 ----------------------------------------- OBSERVATION CARE: This patient is being placed under observation care for the following reasons: Asthmatic requiring repeat treatments and serial exams to determine response to treatment  ----------------------------------------- 6:40 PM on 02/09/2017 ---------------------------------------- Patient feeling improved. Flu pending.  ----------------------------------------- 7:25 PM on 02/09/2017 ----------------------------------------- END OF OBSERVATION STATUS: After an appropriate period of observation, this patient is being discharged due to the following reason(s):  patient with normal work of breathing, moving good air, feels markedly improved. Flu negative. Chest x-ray negative for pneumonia. Labs within normal limits. Patient to be discharged home with an albuterol inhaler, prednisone, close follow-up with primary care doctor. Discussed return precautions.    As part of my medical decision making, I reviewed the following data within the electronic MEDICAL RECORD NUMBER Nursing notes reviewed and incorporated, Labs reviewed , Radiograph reviewed , Notes from prior ED visits and [place] Controlled Substance Database    Pertinent labs & imaging results that were available during my care of the patient were reviewed by me and considered in my medical decision making (see chart for details).    ____________________________________________   FINAL CLINICAL IMPRESSION(S) / ED DIAGNOSES  Final diagnoses:  Viral URI  with cough  Mild intermittent asthma with exacerbation      NEW MEDICATIONS STARTED DURING THIS VISIT:  New Prescriptions   ALBUTEROL (PROVENTIL HFA;VENTOLIN HFA) 108 (90 BASE) MCG/ACT INHALER    Inhale 2 puffs into the lungs every 6 (six) hours as needed for wheezing or shortness of breath.   PREDNISONE (DELTASONE) 20 MG TABLET    Take 3 tablets (60 mg total) by mouth daily.     Note:  This document was prepared using  Dragon voice recognition software and may include unintentional dictation errors.     Nita Sickle, MD 02/09/17 [phone]

## 2017-02-09 NOTE — ED Triage Notes (Signed)
Pt c/o SHOB for about week.  Tachypnea noted in triage but unlabored.  Has had yellow productive cough.  VSS. Febrile 103 at home; took meds before coming, afebrile now.

## 2017-02-09 NOTE — Discharge Instructions (Signed)

## 2017-12-19 ENCOUNTER — Emergency Department (HOSPITAL_BASED_OUTPATIENT_CLINIC_OR_DEPARTMENT_OTHER)
Admission: EM | Admit: 2017-12-19 | Discharge: 2017-12-19 | Disposition: A | Discharge status: Home / Self Care | Payer: Self-pay | Attending: Emergency Medicine | Admitting: Emergency Medicine

## 2017-12-19 ENCOUNTER — Other Ambulatory Visit: Payer: Self-pay

## 2017-12-19 ENCOUNTER — Encounter (HOSPITAL_BASED_OUTPATIENT_CLINIC_OR_DEPARTMENT_OTHER): Payer: Self-pay | Admitting: Emergency Medicine

## 2017-12-19 DIAGNOSIS — Z5321 Procedure and treatment not carried out due to patient leaving prior to being seen by health care provider: Secondary | ICD-10-CM | POA: Insufficient documentation

## 2017-12-19 DIAGNOSIS — M549 Dorsalgia, unspecified: Secondary | ICD-10-CM | POA: Insufficient documentation

## 2017-12-19 DIAGNOSIS — M542 Cervicalgia: Secondary | ICD-10-CM | POA: Insufficient documentation

## 2017-12-19 HISTORY — DX: Unspecified convulsions: R56.9
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
## 2017-12-20 NOTE — ED Notes (Signed)
Follow up call made  Pt was seen at [facility] this am  12/20/17  1221  s Camiah Humm rn

## 2018-03-28 DIAGNOSIS — Z87898 Personal history of other specified conditions: Secondary | ICD-10-CM | POA: Insufficient documentation

## 2018-04-17 DIAGNOSIS — Z8759 Personal history of other complications of pregnancy, childbirth and the puerperium: Secondary | ICD-10-CM

## 2018-04-17 HISTORY — DX: Personal history of other complications of pregnancy, childbirth and the puerperium: Z87.59

## 2018-04-30 ENCOUNTER — Encounter: Payer: Self-pay | Admitting: Obstetrics & Gynecology

## 2018-04-30 ENCOUNTER — Ambulatory Visit (INDEPENDENT_AMBULATORY_CARE_PROVIDER_SITE_OTHER): Payer: Medicaid Other | Admitting: Obstetrics & Gynecology

## 2018-04-30 VITALS — BP 105/72 | HR 92 | Wt 169.0 lb

## 2018-04-30 DIAGNOSIS — Z3480 Encounter for supervision of other normal pregnancy, unspecified trimester: Secondary | ICD-10-CM

## 2018-04-30 DIAGNOSIS — Z3481 Encounter for supervision of other normal pregnancy, first trimester: Secondary | ICD-10-CM | POA: Diagnosis not present

## 2018-04-30 NOTE — Progress Notes (Signed)
Bedside U/S shows single IUP with FHT of 176 BPM and CRL is41.621mm  GA is 11w

## 2018-04-30 NOTE — Progress Notes (Signed)
  Subjective:    Teresa Reed is being seen today for her first obstetrical visit.  This is a planned pregnancy. She is at 2317w0d gestation. Her obstetrical history is significant for h/o seizures (last seizure in May 2019, has appt with neurology in January)  Relationship with FOB: spouse, living together. Patient does intend to breast feed. Pregnancy history fully reviewed.  Patient reports no complaints.  Review of Systems:   Review of Systems  Objective:     BP 105/72   Pulse 92   Wt 169 lb (76.7 kg)   LMP 02/12/2018 (Exact Date)   BMI 29.94 kg/m  Physical Exam  Exam  Heart- rrr Lungs- CTAB Abd- benign     Assessment:    Pregnancy: [id number] Patient Active Problem List   Diagnosis Date Noted  . Supervision of other normal pregnancy, antepartum 04/30/2018       Plan:     Initial labs drawn. Prenatal vitamins. Problem list reviewed and updated. Role of ultrasound in pregnancy discussed; fetal survey: requested and ordered ro 19 weeks Follow up in at 20 weeks [employer] (optimized) She has a cold today, will consider flu vaccine later.  Allie BossierMyra C Jshaun Abernathy 04/30/2018

## 2018-04-30 NOTE — Progress Notes (Signed)
Last pap 04/24/18- negative GC/Chlamydia- negative (04/24/18) Waiting on records for blood work results

## 2018-05-07 ENCOUNTER — Other Ambulatory Visit (HOSPITAL_COMMUNITY)
Admission: RE | Admit: 2018-05-07 | Discharge: 2018-05-07 | Disposition: A | Discharge status: Home / Self Care | Payer: Medicaid Other | Source: Ambulatory Visit | Attending: Obstetrics & Gynecology | Admitting: Obstetrics & Gynecology

## 2018-05-07 ENCOUNTER — Ambulatory Visit (INDEPENDENT_AMBULATORY_CARE_PROVIDER_SITE_OTHER): Payer: Medicaid Other

## 2018-05-07 DIAGNOSIS — Z3480 Encounter for supervision of other normal pregnancy, unspecified trimester: Secondary | ICD-10-CM

## 2018-05-07 NOTE — Progress Notes (Signed)
Pt here for initial prenatal labs and [facility] genetic testing

## 2018-05-08 LAB — GC/CHLAMYDIA PROBE AMP (~~LOC~~) NOT AT ARMC
Chlamydia: NEGATIVE
NEISSERIA GONORRHEA: NEGATIVE

## 2018-05-12 LAB — CULTURE, OB URINE

## 2018-05-12 LAB — URINE CULTURE, OB REFLEX

## 2018-05-14 ENCOUNTER — Other Ambulatory Visit: Payer: Self-pay | Admitting: Obstetrics & Gynecology

## 2018-05-14 ENCOUNTER — Telehealth: Payer: Self-pay

## 2018-05-14 ENCOUNTER — Encounter: Payer: Self-pay | Admitting: Obstetrics & Gynecology

## 2018-05-14 DIAGNOSIS — O2340 Unspecified infection of urinary tract in pregnancy, unspecified trimester: Secondary | ICD-10-CM

## 2018-05-14 DIAGNOSIS — Z3480 Encounter for supervision of other normal pregnancy, unspecified trimester: Secondary | ICD-10-CM

## 2018-05-14 DIAGNOSIS — B951 Streptococcus, group B, as the cause of diseases classified elsewhere: Secondary | ICD-10-CM | POA: Insufficient documentation

## 2018-05-14 MED ORDER — AMPICILLIN 500 MG PO CAPS: 500.0000 mg | ORAL_CAPSULE | Freq: Four times a day (QID) | ORAL | 0 refills | Status: DC

## 2018-05-14 NOTE — Telephone Encounter (Signed)
PT called asking for [facility] genetic testing results. I spoke with pt and told her the results are negative and the fetal sex is female. Pt expressed understanding

## 2018-05-14 NOTE — Progress Notes (Unsigned)
Amp for 7 days for GBS uti.

## 2018-05-16 ENCOUNTER — Encounter: Payer: Self-pay | Admitting: *Deleted

## 2018-05-17 LAB — OBSTETRIC PANEL
Absolute Monocytes: 413 cells/uL (ref 200–950)
Antibody Screen: NOT DETECTED
Basophils Absolute: 47 cells/uL (ref 0–200)
Basophils Relative: 0.6 %
Eosinophils Absolute: 8 cells/uL — ABNORMAL LOW (ref 15–500)
Eosinophils Relative: 0.1 %
HCT: 34.9 % — ABNORMAL LOW (ref 35.0–45.0)
Hemoglobin: 11.6 g/dL — ABNORMAL LOW (ref 11.7–15.5)
Hepatitis B Surface Ag: NONREACTIVE
Lymphs Abs: 1849 cells/uL (ref 850–3900)
MCH: 27.4 pg (ref 27.0–33.0)
MCHC: 33.2 g/dL (ref 32.0–36.0)
MCV: 82.3 fL (ref 80.0–100.0)
MONOS PCT: 5.3 %
MPV: 10.4 fL (ref 7.5–12.5)
Neutro Abs: 5483 cells/uL (ref 1500–7800)
Neutrophils Relative %: 70.3 %
Platelets: 362 10*3/uL (ref 140–400)
RBC: 4.24 10*6/uL (ref 3.80–5.10)
RDW: 12.3 % (ref 11.0–15.0)
RPR Ser Ql: NONREACTIVE
Rubella: 1.99 index
Total Lymphocyte: 23.7 %
WBC: 7.8 10*3/uL (ref 3.8–10.8)

## 2018-05-17 LAB — CYSTIC FIBROSIS DIAGNOSTIC STUDY

## 2018-05-17 LAB — HIV ANTIBODY (ROUTINE TESTING W REFLEX): HIV 1&2 Ab, 4th Generation: NONREACTIVE

## 2018-05-21 ENCOUNTER — Encounter: Payer: Medicaid Other | Admitting: Obstetrics & Gynecology

## 2018-05-23 ENCOUNTER — Ambulatory Visit (INDEPENDENT_AMBULATORY_CARE_PROVIDER_SITE_OTHER): Payer: Medicaid Other | Admitting: *Deleted

## 2018-05-23 ENCOUNTER — Other Ambulatory Visit (HOSPITAL_COMMUNITY)
Admission: RE | Admit: 2018-05-23 | Discharge: 2018-05-23 | Disposition: A | Discharge status: Home / Self Care | Payer: Medicaid Other | Source: Ambulatory Visit | Attending: Obstetrics and Gynecology | Admitting: Obstetrics and Gynecology

## 2018-05-23 DIAGNOSIS — Z3480 Encounter for supervision of other normal pregnancy, unspecified trimester: Secondary | ICD-10-CM

## 2018-05-23 DIAGNOSIS — R309 Painful micturition, unspecified: Secondary | ICD-10-CM | POA: Diagnosis not present

## 2018-05-23 MED ORDER — CEPHALEXIN 500 MG PO CAPS: 500.0000 mg | ORAL_CAPSULE | Freq: Four times a day (QID) | ORAL | 0 refills | Status: DC

## 2018-05-23 NOTE — Progress Notes (Signed)
SUBJECTIVE: Teresa Reed is a 28 y.o. female who complains of urinary frequency, urgency and dysuria x 3 days, with cramping to her back and groin area.  She does state that she has some discharge with odor.  Self swab and urinalysis with culture are sent to lab. OBJECTIVE: Appears well, in no apparent distress.  Vital signs are normal BP 114/62 Wt 171 lbs and Temp is 97.6  Pt just states that she is concerned since she was recently treated for UTI and had the same symptoms. Urine dipstick shows mode leukocytes and mod blood.   ASSESSMENT: Dysuria  PLAN: Treatment with keflex started and culture sent.  Call or return to clinic prn if these symptoms worsen or fail to improve as anticipated.

## 2018-05-24 LAB — CERVICOVAGINAL ANCILLARY ONLY
BACTERIAL VAGINITIS: NEGATIVE
Candida vaginitis: POSITIVE — AB
Trichomonas: NEGATIVE

## 2018-05-25 LAB — URINE CULTURE, OB REFLEX

## 2018-05-25 LAB — CULTURE, OB URINE

## 2018-05-28 ENCOUNTER — Encounter: Payer: Self-pay | Admitting: Family Medicine

## 2018-05-28 ENCOUNTER — Encounter: Payer: Self-pay | Admitting: *Deleted

## 2018-05-28 ENCOUNTER — Ambulatory Visit (INDEPENDENT_AMBULATORY_CARE_PROVIDER_SITE_OTHER): Payer: Medicaid Other | Admitting: Family Medicine

## 2018-05-28 VITALS — BP 103/63 | HR 83 | Wt 173.0 lb

## 2018-05-28 DIAGNOSIS — Z3482 Encounter for supervision of other normal pregnancy, second trimester: Secondary | ICD-10-CM

## 2018-05-28 DIAGNOSIS — Z3480 Encounter for supervision of other normal pregnancy, unspecified trimester: Secondary | ICD-10-CM

## 2018-05-28 DIAGNOSIS — Z3481 Encounter for supervision of other normal pregnancy, first trimester: Secondary | ICD-10-CM

## 2018-05-28 NOTE — Patient Instructions (Signed)

## 2018-05-28 NOTE — Progress Notes (Signed)
Needs RX for yeast

## 2018-05-28 NOTE — Progress Notes (Signed)
   PRENATAL VISIT NOTE  Subjective:  Teresa Reed is a 28 y.o. G3P1011 at [redacted]w[redacted]d being seen today for ongoing prenatal care.  She is currently monitored for the following issues for this high-risk pregnancy and has Supervision of other normal pregnancy, antepartum and GBS (group b Streptococcus) UTI complicating pregnancy, unspecified trimester on their problem list.  Patient reports no complaints.  Contractions: Not present. Vag. Bleeding: None.  Movement: Absent. Denies leaking of fluid.   The following portions of the patient's history were reviewed and updated as appropriate: allergies, current medications, past family history, past medical history, past social history, past surgical history and problem list. Problem list updated.  Objective:   Vitals:   05/28/18 0831  BP: 103/63  Pulse: 83  Weight: 173 lb (78.5 kg)    Fetal Status: Fetal Heart Rate (bpm): 152   Movement: Absent     General:  Alert, oriented and cooperative. Patient is in no acute distress.  Skin: Skin is warm and dry. No rash noted.   Cardiovascular: Normal heart rate noted  Respiratory: Normal respiratory effort, no problems with respiration noted  Abdomen: Soft, gravid, appropriate for gestational age.  Pain/Pressure: Absent     Pelvic: Cervical exam deferred        Extremities: Normal range of motion.  Edema: None  Mental Status: Normal mood and affect. Normal behavior. Normal judgment and thought content.   Assessment and Plan:  Pregnancy: G3P1011 at [redacted]w[redacted]d  1. Supervision of other normal pregnancy, antepartum Continue routine prenatal care. AFP next visit Has anatomy u/s scheduled Low risk NIPT She had + yeast on wet prep, and no symptoms, declines meds.  General obstetric precautions including but not limited to vaginal bleeding, contractions, leaking of fluid and fetal movement were reviewed in detail with the patient. Please refer to After Visit Summary for other counseling recommendations.    Return in 4 weeks (on 06/25/2018).  Future Appointments  Date Time Provider Department Center  06/25/2018  7:30 AM Allie Bossier, MD [facility]  06/26/2018  8:30 AM [facility] [place] 1 [facility]    Reva Bores, MD

## 2018-06-18 ENCOUNTER — Encounter (HOSPITAL_COMMUNITY): Payer: Self-pay

## 2018-06-25 ENCOUNTER — Ambulatory Visit (INDEPENDENT_AMBULATORY_CARE_PROVIDER_SITE_OTHER): Payer: Medicaid Other | Admitting: Obstetrics & Gynecology

## 2018-06-25 VITALS — BP 110/64 | HR 81 | Wt 176.0 lb

## 2018-06-25 DIAGNOSIS — O99820 Streptococcus B carrier state complicating pregnancy: Secondary | ICD-10-CM

## 2018-06-25 DIAGNOSIS — Z3482 Encounter for supervision of other normal pregnancy, second trimester: Secondary | ICD-10-CM

## 2018-06-25 DIAGNOSIS — O2342 Unspecified infection of urinary tract in pregnancy, second trimester: Secondary | ICD-10-CM

## 2018-06-25 DIAGNOSIS — Z3480 Encounter for supervision of other normal pregnancy, unspecified trimester: Secondary | ICD-10-CM

## 2018-06-25 DIAGNOSIS — B951 Streptococcus, group B, as the cause of diseases classified elsewhere: Secondary | ICD-10-CM

## 2018-06-25 DIAGNOSIS — Z3A19 19 weeks gestation of pregnancy: Secondary | ICD-10-CM

## 2018-06-25 DIAGNOSIS — O2340 Unspecified infection of urinary tract in pregnancy, unspecified trimester: Secondary | ICD-10-CM

## 2018-06-25 NOTE — Progress Notes (Signed)
    PRENATAL VISIT NOTE  Subjective:  Teresa Reed is a 28 y.o. G3P1011 at [redacted]w[redacted]d being seen today for ongoing prenatal care.  She is currently monitored for the following issues for this low-risk pregnancy and has Supervision of other normal pregnancy, antepartum and GBS (group b Streptococcus) UTI complicating pregnancy, unspecified trimester on their problem list.  Patient reports no complaints.  Contractions: Not present. Vag. Bleeding: None.  Movement: Present. Denies leaking of fluid.   The following portions of the patient's history were reviewed and updated as appropriate: allergies, current medications, past family history, past medical history, past social history, past surgical history and problem list. Problem list updated.  Objective:   Vitals:   06/25/18 0804  BP: 110/64  Pulse: 81  Weight: 176 lb (79.8 kg)    Fetal Status: Fetal Heart Rate (bpm): 143   Movement: Present     General:  Alert, oriented and cooperative. Patient is in no acute distress.  Skin: Skin is warm and dry. No rash noted.   Cardiovascular: Normal heart rate noted  Respiratory: Normal respiratory effort, no problems with respiration noted  Abdomen: Soft, gravid, appropriate for gestational age.  Pain/Pressure: Absent     Pelvic: Cervical exam deferred        Extremities: Normal range of motion.  Edema: None  Mental Status: Normal mood and affect. Normal behavior. Normal judgment and thought content.   Assessment and Plan:  Pregnancy: G3P1011 at [redacted]w[redacted]d  1. Supervision of other normal pregnancy, antepartum  - Alpha fetoprotein, maternal - AFP, Serum, Open Spina Bifida  2. GBS (group b Streptococcus) UTI complicating pregnancy, unspecified trimester   Preterm labor symptoms and general obstetric precautions including but not limited to vaginal bleeding, contractions, leaking of fluid and fetal movement were reviewed in detail with the patient. Please refer to After Visit Summary for other  counseling recommendations.  No follow-ups on file.  Future Appointments  Date Time Provider Department Center  06/26/2018  8:30 AM [facility] [place] 1 [facility]    Allie Bossier, MD

## 2018-06-26 ENCOUNTER — Other Ambulatory Visit (HOSPITAL_COMMUNITY): Payer: Self-pay | Admitting: Obstetrics and Gynecology

## 2018-06-26 ENCOUNTER — Other Ambulatory Visit: Payer: Self-pay | Admitting: Obstetrics & Gynecology

## 2018-06-26 ENCOUNTER — Ambulatory Visit (HOSPITAL_COMMUNITY)
Admission: RE | Admit: 2018-06-26 | Discharge: 2018-06-26 | Disposition: A | Discharge status: Home / Self Care | Payer: Medicaid Other | Source: Ambulatory Visit | Attending: Obstetrics & Gynecology | Admitting: Obstetrics & Gynecology

## 2018-06-26 DIAGNOSIS — Z3480 Encounter for supervision of other normal pregnancy, unspecified trimester: Secondary | ICD-10-CM

## 2018-06-26 DIAGNOSIS — Z3482 Encounter for supervision of other normal pregnancy, second trimester: Secondary | ICD-10-CM | POA: Insufficient documentation

## 2018-06-26 DIAGNOSIS — Z363 Encounter for antenatal screening for malformations: Secondary | ICD-10-CM

## 2018-06-26 DIAGNOSIS — O359XX0 Maternal care for (suspected) fetal abnormality and damage, unspecified, not applicable or unspecified: Secondary | ICD-10-CM | POA: Diagnosis not present

## 2018-06-26 DIAGNOSIS — Z3A19 19 weeks gestation of pregnancy: Secondary | ICD-10-CM

## 2018-06-26 LAB — ALPHA FETOPROTEIN, MATERNAL
AFP MoM: 0.99
AFP, Serum: 43.2 ng/mL
Calc'd Gestational Age: 19 weeks
Maternal Wt: 173 [lb_av]
Twins-AFP: 1

## 2018-06-27 ENCOUNTER — Telehealth (HOSPITAL_COMMUNITY): Payer: Self-pay | Admitting: *Deleted

## 2018-06-28 ENCOUNTER — Other Ambulatory Visit (HOSPITAL_COMMUNITY): Payer: Self-pay | Admitting: *Deleted

## 2018-07-06 ENCOUNTER — Other Ambulatory Visit (HOSPITAL_COMMUNITY): Payer: Self-pay | Admitting: *Deleted

## 2018-07-06 ENCOUNTER — Other Ambulatory Visit (HOSPITAL_COMMUNITY): Payer: Self-pay | Admitting: Obstetrics and Gynecology

## 2018-07-06 ENCOUNTER — Encounter

## 2018-07-10 NOTE — Telephone Encounter (Signed)
Informed patient of normal fetal karyotype results. Patient had fetal brain MRI at [facility] yesterday and the results are pending. She is extremely anxious to know the results.  I informed her that we will reach MRI dept tomorrow. I also informed her that I will make arrangements for her to be seen ny MFM at [facility] in case delivery is recommended at [facility]

## 2018-07-11 ENCOUNTER — Telehealth (HOSPITAL_COMMUNITY): Payer: Self-pay | Admitting: Obstetrics and Gynecology

## 2018-07-11 NOTE — Progress Notes (Unsigned)
Received call from Aaron Edelman, RN from [facility] regarding pt coming in today to sign Release of info to them to have an Consult regarding [place] results, being referred by Dr. Franciso Bend & will need all her OB Info but they wanted it to be clear that she is not transferring OB care. Advised would note account.

## 2018-07-11 NOTE — Telephone Encounter (Signed)
I called Ms. Mignano and explained fetal brain MRI results. I recommended neurosurgery consultation at [facility]. Patient agreed. We will set up an appointment with [facility] and pediatric neurosugrery.

## 2018-07-17 ENCOUNTER — Telehealth: Payer: Self-pay | Admitting: General Practice

## 2018-07-17 NOTE — Telephone Encounter (Signed)
Patient called and left message on nurse voicemail line stating she is calling to find out where she can be tested for the coronavirus.

## 2018-07-19 ENCOUNTER — Other Ambulatory Visit (HOSPITAL_COMMUNITY): Payer: Self-pay | Admitting: Obstetrics & Gynecology

## 2018-07-19 LAB — INSIGHT AMNIO FISH XY,13,18,21
Cells Analyzed: 50
Cells Counted: 50

## 2018-07-19 LAB — CHROMOSOME MICROARRAY REFLEX, AMN FLD

## 2018-07-19 LAB — CHROMOSOME ANALYSIS W REFLEX TO SNP, AMNIOTIC
052138: 15
Cells Analyzed: 15
Cells Karyotyped: 0
Colonies: 15
GTG Band Resolution Achieved: 450

## 2018-07-19 LAB — TOXOPLASMA GONDII, AMNIOTIC FLUID, PCR: TOXOPLASMA GONDII PCR AMNIOTIC: NEGATIVE

## 2018-07-19 LAB — AFP, AMNIOTIC FLUID
AFP, Amniotic Fluid (mcg/ml): 12.5 ug/mL
Gestational Age(Wks): 19
MOM, Amniotic Fluid: 1.68

## 2018-07-19 LAB — CYTOMEGALOVIRUS (CMV), AMNIOTIC FLUID, PCR: CMV PCR, Amniotic: NEGATIVE

## 2018-07-19 LAB — SPECIMEN STATUS REPORT

## 2018-07-23 ENCOUNTER — Other Ambulatory Visit (HOSPITAL_COMMUNITY): Payer: Self-pay | Admitting: *Deleted

## 2018-07-23 ENCOUNTER — Encounter: Payer: Self-pay | Admitting: *Deleted

## 2018-07-23 ENCOUNTER — Ambulatory Visit (INDEPENDENT_AMBULATORY_CARE_PROVIDER_SITE_OTHER): Payer: Medicaid Other | Admitting: Obstetrics & Gynecology

## 2018-07-23 ENCOUNTER — Other Ambulatory Visit: Payer: Self-pay

## 2018-07-23 VITALS — BP 97/56 | HR 78 | Temp 97.4°F | Wt 180.0 lb

## 2018-07-23 DIAGNOSIS — O350XX0 Maternal care for (suspected) central nervous system malformation in fetus, not applicable or unspecified: Secondary | ICD-10-CM

## 2018-07-23 DIAGNOSIS — O3506X0 Maternal care for (suspected) central nervous system malformation or damage in fetus, hydrocephaly, not applicable or unspecified: Secondary | ICD-10-CM

## 2018-07-23 DIAGNOSIS — Z3A23 23 weeks gestation of pregnancy: Secondary | ICD-10-CM

## 2018-07-23 DIAGNOSIS — O0992 Supervision of high risk pregnancy, unspecified, second trimester: Secondary | ICD-10-CM

## 2018-07-23 DIAGNOSIS — O0990 Supervision of high risk pregnancy, unspecified, unspecified trimester: Secondary | ICD-10-CM | POA: Insufficient documentation

## 2018-07-23 DIAGNOSIS — IMO0002 Reserved for concepts with insufficient information to code with codable children: Secondary | ICD-10-CM

## 2018-07-23 NOTE — Progress Notes (Signed)
Pt here for Nurse OB visit.  She has an appt with DrShankar tomorrow and with genetics and [facility] on Wednesday.  She denies any problems at this time.  She was placed on BRX opt scheduled and BP cuff has synced.  She will have a telemed visit with Dr Penne Lash in 2 weeks and return in 4 weeks for 28 week labs.  I have reviewed the chart and agree with nursing staff's documentation of this patient's encounter.  Elsie Lincoln, MD 07/23/2018 3:32 PM

## 2018-07-24 ENCOUNTER — Encounter (HOSPITAL_COMMUNITY): Payer: Self-pay

## 2018-07-24 ENCOUNTER — Ambulatory Visit (HOSPITAL_COMMUNITY)
Admission: RE | Admit: 2018-07-24 | Discharge: 2018-07-24 | Disposition: A | Discharge status: Home / Self Care | Payer: Medicaid Other | Source: Ambulatory Visit | Attending: Obstetrics & Gynecology | Admitting: Obstetrics & Gynecology

## 2018-07-24 ENCOUNTER — Other Ambulatory Visit (HOSPITAL_COMMUNITY): Payer: Self-pay | Admitting: *Deleted

## 2018-07-24 ENCOUNTER — Ambulatory Visit (HOSPITAL_COMMUNITY): Payer: Medicaid Other | Admitting: *Deleted

## 2018-07-24 VITALS — BP 112/64 | HR 82 | Temp 98.4°F

## 2018-07-24 DIAGNOSIS — O3506X0 Maternal care for (suspected) central nervous system malformation or damage in fetus, hydrocephaly, not applicable or unspecified: Secondary | ICD-10-CM

## 2018-07-24 DIAGNOSIS — Z3A23 23 weeks gestation of pregnancy: Secondary | ICD-10-CM | POA: Diagnosis not present

## 2018-07-24 DIAGNOSIS — Z362 Encounter for other antenatal screening follow-up: Secondary | ICD-10-CM | POA: Diagnosis not present

## 2018-07-24 DIAGNOSIS — O350XX0 Maternal care for (suspected) central nervous system malformation in fetus, not applicable or unspecified: Secondary | ICD-10-CM | POA: Insufficient documentation

## 2018-07-24 DIAGNOSIS — O0990 Supervision of high risk pregnancy, unspecified, unspecified trimester: Secondary | ICD-10-CM

## 2018-07-24 DIAGNOSIS — IMO0002 Reserved for concepts with insufficient information to code with codable children: Secondary | ICD-10-CM

## 2018-07-24 DIAGNOSIS — O359XX0 Maternal care for (suspected) fetal abnormality and damage, unspecified, not applicable or unspecified: Secondary | ICD-10-CM

## 2018-07-25 ENCOUNTER — Ambulatory Visit (HOSPITAL_COMMUNITY): Payer: Self-pay

## 2018-07-25 ENCOUNTER — Encounter (HOSPITAL_COMMUNITY): Payer: Self-pay | Admitting: Obstetrics & Gynecology

## 2018-07-25 ENCOUNTER — Encounter (HOSPITAL_COMMUNITY): Payer: Self-pay | Admitting: Obstetrics and Gynecology

## 2018-07-25 ENCOUNTER — Ambulatory Visit (HOSPITAL_COMMUNITY): Payer: Self-pay | Admitting: Obstetrics and Gynecology

## 2018-08-06 ENCOUNTER — Ambulatory Visit (INDEPENDENT_AMBULATORY_CARE_PROVIDER_SITE_OTHER): Payer: Medicaid Other | Admitting: Obstetrics & Gynecology

## 2018-08-06 ENCOUNTER — Other Ambulatory Visit: Payer: Self-pay

## 2018-08-06 DIAGNOSIS — F411 Generalized anxiety disorder: Secondary | ICD-10-CM

## 2018-08-06 DIAGNOSIS — O99342 Other mental disorders complicating pregnancy, second trimester: Secondary | ICD-10-CM | POA: Diagnosis not present

## 2018-08-06 DIAGNOSIS — O359XX1 Maternal care for (suspected) fetal abnormality and damage, unspecified, fetus 1: Secondary | ICD-10-CM

## 2018-08-06 DIAGNOSIS — Z3A25 25 weeks gestation of pregnancy: Secondary | ICD-10-CM

## 2018-08-06 DIAGNOSIS — O0990 Supervision of high risk pregnancy, unspecified, unspecified trimester: Secondary | ICD-10-CM

## 2018-08-06 DIAGNOSIS — O359XX0 Maternal care for (suspected) fetal abnormality and damage, unspecified, not applicable or unspecified: Secondary | ICD-10-CM | POA: Insufficient documentation

## 2018-08-06 NOTE — Progress Notes (Signed)
I connected with Dyann Ruddle on 08/06/18 at  9:15 AM EDT by: telephone and verified that I am speaking with the correct person using two identifiers.  Patient is located at home and provider is located at [employer].     The purpose of this virtual visit is to provide medical care while limiting exposure to the novel coronavirus. I discussed the limitations, risks, security and privacy concerns of performing an evaluation and management service by telephone and the availability of in person appointments. I also discussed with the patient that there may be a patient responsible charge related to this service. By engaging in this virtual visit, you consent to the provision of healthcare.  Additionally, you authorize for your insurance to be billed for the services provided during this visit.  The patient expressed understanding and agreed to proceed.  The following staff members participated in the virtual visit:  Deanna Sola    PRENATAL VISIT NOTE  Subjective:  Teresa Reed is a 28 y.o. G3P1011 at [redacted]w[redacted]d for phone visit for ongoing prenatal care.  She is currently monitored for the following issues for this high-risk pregnancy and has Supervision of other normal pregnancy, antepartum; GBS (group b Streptococcus) UTI complicating pregnancy, unspecified trimester; and Late entry into [place] 2020 social distancing  on their problem list.  Patient reports no complaints except some leakage of fluid from breast.   Denies leaking of fluid, vaginal bleeding, contractions.   The following portions of the patient's history were reviewed and updated as appropriate: allergies, current medications, past family history, past medical history, past social history, past surgical history and problem list.   Objective:  There were no vitals filed for this visit. Self-Obtained  BP is 108/63 today  Fetal Status: Good fetal mvmt  Assessment and Plan:  Pregnancy: G3P1011 at 25w0dPreterm labor  symptoms and general obstetric precautions including but not limited to vaginal bleeding, contractions, leaking of fluid and fetal movement were reviewed in detail with the patient.  Patient Active Problem List   Diagnosis Date Noted  . Late entry into BRX March 2020 social distancing  07/23/2018  . GBS (group b Streptococcus) UTI complicating pregnancy, unspecified trimester 05/14/2018  . Supervision of other normal pregnancy, antepartum 04/30/2018   Met with neurosurgery [facility]--the neurosurgeon and MFM will decide place of delivery.  Pt has MFM apppt on 4/28--will need to make sure Dr. ShDonalee Citrins available to talk with patient.    Net visit at 28 weeks in person. Reviewed cloth masks when leaving home and social distancing.    Future Appointments  Date Time Provider [place]/20/2020  8:00 AM DoEmily FilbertMD [facility]  8:00 AM [facility] [place] WH-MFCUS MFC-US  08/28/2018  9:15 AM [facility] LAB [place]     Time spent on virtual visit: 15 minutes  KeSilas SacramentoMD

## 2018-08-07 ENCOUNTER — Encounter (HOSPITAL_COMMUNITY): Payer: Self-pay | Admitting: Obstetrics and Gynecology

## 2018-08-08 ENCOUNTER — Encounter: Payer: Self-pay | Admitting: *Deleted

## 2018-08-17 ENCOUNTER — Telehealth: Payer: Self-pay

## 2018-08-17 NOTE — Telephone Encounter (Signed)
Patient called the office saying that she was cut off due to her phone dying a few days ago.  Patient has appt Monday 08/20/2018 with [facility] [place] Informed patient of no visitor policy, advised to have facial covering (mask, scarf, etc) and patient had negative Covid 19 screening. Armandina Stammer RN

## 2018-08-20 ENCOUNTER — Encounter: Payer: Self-pay | Admitting: Obstetrics & Gynecology

## 2018-08-20 ENCOUNTER — Other Ambulatory Visit: Payer: Self-pay

## 2018-08-20 ENCOUNTER — Ambulatory Visit (INDEPENDENT_AMBULATORY_CARE_PROVIDER_SITE_OTHER): Payer: Medicaid Other | Admitting: Obstetrics & Gynecology

## 2018-08-20 VITALS — BP 110/68 | HR 84 | Wt 186.0 lb

## 2018-08-20 DIAGNOSIS — Z3A27 27 weeks gestation of pregnancy: Secondary | ICD-10-CM

## 2018-08-20 DIAGNOSIS — Z23 Encounter for immunization: Secondary | ICD-10-CM | POA: Diagnosis not present

## 2018-08-20 DIAGNOSIS — O359XX1 Maternal care for (suspected) fetal abnormality and damage, unspecified, fetus 1: Secondary | ICD-10-CM

## 2018-08-20 DIAGNOSIS — O2342 Unspecified infection of urinary tract in pregnancy, second trimester: Secondary | ICD-10-CM | POA: Diagnosis not present

## 2018-08-20 DIAGNOSIS — B951 Streptococcus, group B, as the cause of diseases classified elsewhere: Secondary | ICD-10-CM | POA: Diagnosis not present

## 2018-08-20 DIAGNOSIS — O2340 Unspecified infection of urinary tract in pregnancy, unspecified trimester: Secondary | ICD-10-CM

## 2018-08-20 DIAGNOSIS — Z3480 Encounter for supervision of other normal pregnancy, unspecified trimester: Secondary | ICD-10-CM

## 2018-08-20 MED ORDER — BREAST PUMP MISC: 1.0000 | 0 refills | Status: DC | PRN

## 2018-08-20 NOTE — Progress Notes (Signed)
   PRENATAL VISIT NOTE  Subjective:  Teresa Reed is a 28 y.o. G3P1011 at [redacted]w[redacted]d being seen today for ongoing prenatal care.  She is currently monitored for the following issues for this high-risk pregnancy and has Supervision of other normal pregnancy, antepartum; GBS (group b Streptococcus) UTI complicating pregnancy, unspecified trimester; Late entry into BRX March 2020 social distancing ; History of ectopic pregnancy; History of seizure; GAD (generalized anxiety disorder); and Suspected fetal anomaly, antepartum on their problem list.  Patient reports no complaints.  Contractions: Irritability. Vag. Bleeding: None.  Movement: Present. Denies leaking of fluid.   The following portions of the patient's history were reviewed and updated as appropriate: allergies, current medications, past family history, past medical history, past social history, past surgical history and problem list.   Objective:   Vitals:   08/20/18 0820  BP: 110/68  Pulse: 84  Weight: 186 lb (84.4 kg)    Fetal Status: Fetal Heart Rate (bpm): 150   Movement: Present     General:  Alert, oriented and cooperative. Patient is in no acute distress.  Skin: Skin is warm and dry. No rash noted.   Cardiovascular: Normal heart rate noted  Respiratory: Normal respiratory effort, no problems with respiration noted  Abdomen: Soft, gravid, appropriate for gestational age.  Pain/Pressure: Present     Pelvic: Cervical exam deferred        Extremities: Normal range of motion.  Edema: None  Mental Status: Normal mood and affect. Normal behavior. Normal judgment and thought content.   Assessment and Plan:  Pregnancy: G3P1011 at [redacted]w[redacted]d 1. Supervision of other normal pregnancy, antepartum  - 2Hr GTT w/ 1 Hr Carpenter 75 g - HIV antibody (with reflex) - CBC - RPR - Tdap vaccine greater than or equal to 7yo IM - Misc. Devices (BREAST PUMP) MISC; 1 Device by Does not apply route as needed.  Dispense: 1 each; Refill: 0  2.  Suspected fetal anomaly, antepartum, fetus 1 of multiple gestation - ventriculomegaly - she has a f/u MFM u/s next week - considering delivering at [facility] where she has already had a consult  3. GBS (group b Streptococcus) UTI complicating pregnancy, unspecified trimester - treat in lable  Preterm labor symptoms and general obstetric precautions including but not limited to vaginal bleeding, contractions, leaking of fluid and fetal movement were reviewed in detail with the patient. Please refer to After Visit Summary for other counseling recommendations.   Return in about 3 weeks (around 09/10/2018) for televisit.  Future Appointments  Date Time Provider Department Center  08/28/2018  8:00 AM [facility]-MFC [place] 3 WH-MFCUS MFC-US  08/28/2018  9:15 AM WH-MFC LAB [facility]-MFC MFC-US    Allie Bossier, MD

## 2018-08-28 ENCOUNTER — Ambulatory Visit (HOSPITAL_COMMUNITY): Payer: Medicaid Other | Admitting: *Deleted

## 2018-08-28 ENCOUNTER — Other Ambulatory Visit (HOSPITAL_COMMUNITY): Payer: Self-pay | Admitting: *Deleted

## 2018-08-28 ENCOUNTER — Encounter (HOSPITAL_COMMUNITY): Payer: Self-pay

## 2018-08-28 ENCOUNTER — Ambulatory Visit (HOSPITAL_COMMUNITY)
Admission: RE | Admit: 2018-08-28 | Discharge: 2018-08-28 | Disposition: A | Discharge status: Home / Self Care | Payer: Medicaid Other | Source: Ambulatory Visit | Attending: Obstetrics and Gynecology | Admitting: Obstetrics and Gynecology

## 2018-08-28 ENCOUNTER — Other Ambulatory Visit: Payer: Self-pay

## 2018-08-28 VITALS — BP 111/54 | HR 77 | Temp 98.5°F

## 2018-08-28 DIAGNOSIS — B951 Streptococcus, group B, as the cause of diseases classified elsewhere: Secondary | ICD-10-CM | POA: Diagnosis present

## 2018-08-28 DIAGNOSIS — O350XX0 Maternal care for (suspected) central nervous system malformation in fetus, not applicable or unspecified: Secondary | ICD-10-CM

## 2018-08-28 DIAGNOSIS — Z362 Encounter for other antenatal screening follow-up: Secondary | ICD-10-CM | POA: Diagnosis not present

## 2018-08-28 DIAGNOSIS — O359XX1 Maternal care for (suspected) fetal abnormality and damage, unspecified, fetus 1: Secondary | ICD-10-CM | POA: Diagnosis present

## 2018-08-28 DIAGNOSIS — IMO0002 Reserved for concepts with insufficient information to code with codable children: Secondary | ICD-10-CM

## 2018-08-28 DIAGNOSIS — Z3480 Encounter for supervision of other normal pregnancy, unspecified trimester: Secondary | ICD-10-CM | POA: Insufficient documentation

## 2018-08-28 DIAGNOSIS — O2340 Unspecified infection of urinary tract in pregnancy, unspecified trimester: Secondary | ICD-10-CM

## 2018-08-28 DIAGNOSIS — O283 Abnormal ultrasonic finding on antenatal screening of mother: Secondary | ICD-10-CM

## 2018-08-28 DIAGNOSIS — O3506X0 Maternal care for (suspected) central nervous system malformation or damage in fetus, hydrocephaly, not applicable or unspecified: Secondary | ICD-10-CM

## 2018-09-05 LAB — HIV ANTIBODY (ROUTINE TESTING W REFLEX): HIV 1&2 Ab, 4th Generation: NONREACTIVE

## 2018-09-05 LAB — 2HR GTT W 1 HR, CARPENTER, 75 G
Glucose, 1 Hr, Gest: 125 mg/dL (ref 65–179)
Glucose, 2 Hr, Gest: 91 mg/dL (ref 65–152)
Glucose, Fasting, Gest: 83 mg/dL (ref 65–91)

## 2018-09-05 LAB — CBC
HCT: 31.1 % — ABNORMAL LOW (ref 35.0–45.0)
Hemoglobin: 10.5 g/dL — ABNORMAL LOW (ref 11.7–15.5)
MCH: 27.7 pg (ref 27.0–33.0)
MCHC: 33.8 g/dL (ref 32.0–36.0)
MCV: 82.1 fL (ref 80.0–100.0)
MPV: 10.3 fL (ref 7.5–12.5)
Platelets: 335 10*3/uL (ref 140–400)
RBC: 3.79 10*6/uL — ABNORMAL LOW (ref 3.80–5.10)
RDW: 13.1 % (ref 11.0–15.0)
WBC: 9.6 10*3/uL (ref 3.8–10.8)

## 2018-09-05 LAB — RPR: RPR Ser Ql: NONREACTIVE

## 2018-09-07 ENCOUNTER — Ambulatory Visit (HOSPITAL_COMMUNITY): Payer: Medicaid Other

## 2018-09-07 ENCOUNTER — Ambulatory Visit (HOSPITAL_COMMUNITY): Payer: Medicaid Other | Attending: Obstetrics and Gynecology

## 2018-09-10 ENCOUNTER — Other Ambulatory Visit: Payer: Self-pay

## 2018-09-10 ENCOUNTER — Encounter: Payer: Medicaid Other | Admitting: Obstetrics & Gynecology

## 2018-09-10 ENCOUNTER — Telehealth: Payer: Self-pay

## 2018-09-10 NOTE — Progress Notes (Signed)
This encounter was created in error - please disregard.

## 2018-09-10 NOTE — Telephone Encounter (Signed)
Called pt again to begin televist. Pt did not answer.

## 2018-09-10 NOTE — Telephone Encounter (Signed)
Attempted to call pt to begin televisit. PT did not answer. VM left asking pt to call us and begin visit.

## 2018-09-26 ENCOUNTER — Encounter (HOSPITAL_COMMUNITY): Payer: Self-pay

## 2018-09-26 ENCOUNTER — Other Ambulatory Visit: Payer: Self-pay

## 2018-09-26 ENCOUNTER — Ambulatory Visit (HOSPITAL_COMMUNITY)
Admission: RE | Admit: 2018-09-26 | Discharge: 2018-09-26 | Disposition: A | Discharge status: Home / Self Care | Payer: Medicaid Other | Source: Ambulatory Visit | Attending: Obstetrics and Gynecology | Admitting: Obstetrics and Gynecology

## 2018-09-26 ENCOUNTER — Other Ambulatory Visit (HOSPITAL_COMMUNITY): Payer: Self-pay | Admitting: *Deleted

## 2018-09-26 ENCOUNTER — Ambulatory Visit (HOSPITAL_COMMUNITY): Payer: Medicaid Other | Admitting: *Deleted

## 2018-09-26 VITALS — BP 102/59 | HR 80 | Temp 97.5°F

## 2018-09-26 DIAGNOSIS — Z362 Encounter for other antenatal screening follow-up: Secondary | ICD-10-CM | POA: Diagnosis not present

## 2018-09-26 DIAGNOSIS — O350XX0 Maternal care for (suspected) central nervous system malformation in fetus, not applicable or unspecified: Secondary | ICD-10-CM | POA: Insufficient documentation

## 2018-09-26 DIAGNOSIS — O359XX0 Maternal care for (suspected) fetal abnormality and damage, unspecified, not applicable or unspecified: Secondary | ICD-10-CM

## 2018-09-26 DIAGNOSIS — Z3A00 Weeks of gestation of pregnancy not specified: Secondary | ICD-10-CM

## 2018-09-26 DIAGNOSIS — O3506X0 Maternal care for (suspected) central nervous system malformation or damage in fetus, hydrocephaly, not applicable or unspecified: Secondary | ICD-10-CM

## 2018-10-04 ENCOUNTER — Telehealth: Payer: Self-pay | Admitting: *Deleted

## 2018-10-04 NOTE — Telephone Encounter (Signed)
Left patient a message to call and reschedule NO SHOW appointment on 09/10/2018 for in office and to schedule 36 week in office for GBS.

## 2018-10-11 DIAGNOSIS — M797 Fibromyalgia: Secondary | ICD-10-CM | POA: Insufficient documentation

## 2018-10-24 ENCOUNTER — Ambulatory Visit (HOSPITAL_COMMUNITY)
Admission: RE | Admit: 2018-10-24 | Discharge: 2018-10-24 | Disposition: A | Discharge status: Home / Self Care | Payer: Medicaid Other | Source: Ambulatory Visit | Attending: Obstetrics and Gynecology | Admitting: Obstetrics and Gynecology

## 2018-10-24 ENCOUNTER — Encounter (HOSPITAL_COMMUNITY): Payer: Self-pay

## 2018-10-24 ENCOUNTER — Ambulatory Visit (HOSPITAL_COMMUNITY): Payer: Medicaid Other

## 2018-10-28 MED ORDER — PNV PRENATAL PLUS MULTIVITAMIN 27-1 MG PO TABS
1.00 | ORAL_TABLET | ORAL | Status: DC
Start: 2018-10-29 — End: 2018-10-28

## 2018-10-28 MED ORDER — Medication
500.00 | Status: DC
Start: 2018-10-29 — End: 2018-10-28

## 2018-10-28 MED ORDER — CVS EAR DROPS OT
40.00 | OTIC | Status: DC
Start: 2018-10-29 — End: 2018-10-28

## 2018-10-28 MED ORDER — QUINERVA 260 MG PO TABS
975.00 | ORAL_TABLET | ORAL | Status: DC
Start: 2018-10-28 — End: 2018-10-28

## 2018-10-28 MED ORDER — Medication
15.00 | Status: DC
Start: ? — End: 2018-10-28

## 2018-10-28 MED ORDER — GLUCOSAMINE-CHONDROIT-COLLAGEN PO
100.00 | ORAL | Status: DC
Start: 2018-10-28 — End: 2018-10-28

## 2018-10-28 MED ORDER — FUTURO SOFT CERVICAL COLLAR MISC
600.00 | Status: DC
Start: 2018-10-28 — End: 2018-10-28

## 2018-10-28 MED ORDER — Medication
5.00 | Status: DC
Start: ? — End: 2018-10-28

## 2018-10-28 MED ORDER — PREDNISOLONE SODIUM PHOSPHATE (GLUCOCORTICOSTEROIDS)
324.00 | Status: DC
Start: 2018-10-29 — End: 2018-10-28

## 2018-10-28 MED ORDER — Medication
10.00 | Status: DC
Start: ? — End: 2018-10-28

## 2018-10-28 MED ORDER — METRISET BURETTE SET MISC
Status: DC
Start: ? — End: 2018-10-28

## 2018-10-28 MED ORDER — RICOLA HERB MT
80.00 | OROMUCOSAL | Status: DC
Start: ? — End: 2018-10-28

## 2018-10-28 MED ORDER — EQUATE NICOTINE 4 MG MT GUM
4.00 | CHEWING_GUM | OROMUCOSAL | Status: DC
Start: ? — End: 2018-10-28

## 2019-02-11 ENCOUNTER — Encounter (HOSPITAL_COMMUNITY): Payer: Self-pay

## 2019-05-21 ENCOUNTER — Other Ambulatory Visit: Payer: Self-pay

## 2019-05-31 ENCOUNTER — Encounter: Payer: Self-pay | Admitting: *Deleted

## 2019-06-03 ENCOUNTER — Encounter: Payer: Medicaid Other | Admitting: Obstetrics & Gynecology

## 2019-06-05 NOTE — Progress Notes (Signed)
This encounter was created in error - please disregard.

## 2019-06-13 ENCOUNTER — Telehealth: Payer: Self-pay | Admitting: *Deleted

## 2019-06-13 NOTE — Telephone Encounter (Signed)
Patient called to reschedule appointment from 06/03/19, her phone disconnected, I called patient back, no answer, and mailbox is full.

## 2019-06-17 ENCOUNTER — Encounter: Payer: Medicaid Other | Admitting: Obstetrics & Gynecology

## 2019-06-24 ENCOUNTER — Ambulatory Visit (INDEPENDENT_AMBULATORY_CARE_PROVIDER_SITE_OTHER): Payer: Medicaid Other | Admitting: Obstetrics & Gynecology

## 2019-06-24 ENCOUNTER — Encounter: Payer: Self-pay | Admitting: Obstetrics & Gynecology

## 2019-06-24 ENCOUNTER — Other Ambulatory Visit: Payer: Self-pay

## 2019-06-24 ENCOUNTER — Other Ambulatory Visit (HOSPITAL_COMMUNITY)
Admission: RE | Admit: 2019-06-24 | Discharge: 2019-06-24 | Disposition: A | Discharge status: Home / Self Care | Payer: Medicaid Other | Source: Ambulatory Visit | Attending: Obstetrics & Gynecology | Admitting: Obstetrics & Gynecology

## 2019-06-24 VITALS — BP 107/64 | HR 82 | Temp 98.2°F | Wt 189.0 lb

## 2019-06-24 DIAGNOSIS — O09299 Supervision of pregnancy with other poor reproductive or obstetric history, unspecified trimester: Secondary | ICD-10-CM | POA: Insufficient documentation

## 2019-06-24 DIAGNOSIS — Z3480 Encounter for supervision of other normal pregnancy, unspecified trimester: Secondary | ICD-10-CM | POA: Diagnosis present

## 2019-06-24 DIAGNOSIS — O09292 Supervision of pregnancy with other poor reproductive or obstetric history, second trimester: Secondary | ICD-10-CM | POA: Diagnosis not present

## 2019-06-24 DIAGNOSIS — O09892 Supervision of other high risk pregnancies, second trimester: Secondary | ICD-10-CM

## 2019-06-24 DIAGNOSIS — O09899 Supervision of other high risk pregnancies, unspecified trimester: Secondary | ICD-10-CM | POA: Insufficient documentation

## 2019-06-24 DIAGNOSIS — Z98891 History of uterine scar from previous surgery: Secondary | ICD-10-CM | POA: Insufficient documentation

## 2019-06-24 DIAGNOSIS — Z3482 Encounter for supervision of other normal pregnancy, second trimester: Secondary | ICD-10-CM

## 2019-06-24 NOTE — Progress Notes (Signed)
Femur length shows gestational age of 42 w  2.63cm

## 2019-06-24 NOTE — Progress Notes (Signed)
History:   CATALAYA GARR is a 29 y.o. [id number] at [redacted]w[redacted]d by LMP, midtrimester ultrasound here in office being seen today for her first obstetrical visit.  Her obstetrical history is significant for having a baby with hydrocephalus last year, delivered via classical cesarean section at 36 weeks due to fetal distress at [facility] in June 2020.   Baby followed at [facility], has V-P shunts, has undergone multiple surgeries but overall thriving well. Patient was on OCPs and did not know she was pregnant until recently. Took antibiotics a few months ago for UTI, did not use backup contraception. Unplanned pregnancy so soon after previous one, but very desired.   Patient does intend to breast and bottle feed. Pregnancy history fully reviewed.  Patient reports no complaints.      HISTORY: OB History  Gravida Para Term Preterm AB Living  4 2 1 1 1 2   SAB TAB Ectopic Multiple Live Births  1 0 0 0 2    # Outcome Date GA Lbr Len/2nd Weight Sex Delivery Anes PTL Lv  4 Current           3 Preterm 10/30/18 [redacted]w[redacted]d   M CS-Classical  N LIV     Birth Comments: Marked hydrocephalus      Complications: Fetal Intolerance  2 SAB 10/29/17 [redacted]w[redacted]d         1 Term 02/18/07 [redacted]w[redacted]d  7 lb 15 oz (3.6 kg) M Vag-Spont   LIV    Obstetric Comments  LOW VERTICAL c-section WITH EXTENSION INTO ACTIVE SEGMENT at [redacted]w[redacted]d for non-reassuring fetal status in the setting of marked hydrocephalus necessitating cesarean delivery on 10/30/2018; basically classical cesarean section    Last pap smear was done 2019 and was normal  Past Medical History:  Diagnosis Date  . History of ectopic pregnancy 04/17/2018   G2, 2009  . Medical history non-contributory    Past Surgical History:  Procedure Laterality Date  . TUMOR REMOVAL     Family History  Problem Relation Age of Onset  . Lupus Sister   . Lupus Other   . Hypertension Other   . Diabetes Other    Social History   Tobacco Use  . Smoking status: Former 2010  . Smokeless  tobacco: Never Used  Substance Use Topics  . Alcohol use: No  . Drug use: No   No Known Allergies Current Outpatient Medications on File Prior to Visit  Medication Sig Dispense Refill  . Prenatal Vit-Fe Fumarate-FA (MULTIVITAMIN-PRENATAL) 27-0.8 MG TABS tablet Take 1 tablet by mouth daily at 12 noon.    . Misc. Devices (BREAST PUMP) MISC 1 Device by Does not apply route as needed. 1 each 0  . montelukast (SINGULAIR) 10 MG tablet Take 10 mg by mouth at bedtime.     No current facility-administered medications on file prior to visit.    Review of Systems Pertinent items noted in HPI and remainder of comprehensive ROS otherwise negative. Physical Exam:   Vitals:   06/24/19 0928  BP: 107/64  Pulse: 82  Temp: 98.2 F (36.8 C)  Weight: 189 lb (85.7 kg)   Fetal Heart Rate (bpm): 166 Uterus:     Pelvic Exam: Perineum: no hemorrhoids, normal perineum   Vulva: normal external genitalia, no lesions   Vagina:  normal mucosa, normal discharge   Cervix: no lesions and normal, pap smear done.    Adnexa: normal adnexa and no mass, fullness, tenderness   Bony Pelvis: average  System: General: well-developed, well-nourished female in  no acute distress   Breasts:  normal appearance, no masses or tenderness bilaterally   Skin: normal coloration and turgor, no rashes   Neurologic: oriented, normal, negative, normal mood   Extremities: normal strength, tone, and muscle mass, ROM of all joints is normal   HEENT PERRLA, extraocular movement intact and sclera clear, anicteric   Mouth/Teeth mucous membranes moist, pharynx normal without lesions and dental hygiene good   Neck supple and no masses   Cardiovascular: regular rate and rhythm   Respiratory:  no respiratory distress, normal breath sounds   Abdomen: soft, non-tender; bowel sounds normal; no masses,  no organomegaly     Assessment:    Pregnancy: [id number] Patient Active Problem List   Diagnosis Date Noted  . History of classical  cesarean section 06/24/2019  . History of fetal hydrocephalus in previous pregnancy, currently pregnant 06/24/2019  . Short interval between pregnancies affecting pregnancy, antepartum 06/24/2019  . Supervision of other normal pregnancy, antepartum 04/30/2018  . History of seizure 03/28/2018  . GAD (generalized anxiety disorder) 11/26/2014     Plan:    1. History of fetal hydrocephalus in previous pregnancy, currently pregnant - [place] MFM OB DETAIL +14 WK; Future - AFP, Serum, Open Spina Bifida  2. History of classical cesarean section Will be scheduled for RCS between 36-37 weeks, patient is aware of this.  3. Short interval between pregnancies affecting pregnancy, antepartum 4. Supervision of other normal pregnancy, antepartum - Culture, OB Urine - Obstetric Panel, Including HIV - Cytology - PAP - Hepatitis C antibody - Genetic Screening - US MFM OB DETAIL +14 WK; Future - Hemoglobinopathy Evaluation - HgB A1c - Comprehensive metabolic panel - AFP, Serum, Open Spina Bifida - SMN1 Copy Number Analysis Initial labs drawn. Continue prenatal vitamins. Genetic Screening discussed, NIPS: ordered. Ultrasound discussed; fetal anatomic survey: ordered. Contemplating sterilization, will sign papers later in pregnancy if desires Problem list reviewed and updated. The nature of [facility] with multiple MDs and other Advanced Practice Providers was explained to patient; also emphasized that residents, students are part of our team. Routine obstetric precautions reviewed. Return in about 4 weeks (around 07/22/2019) for Virtual OB Visit.     Verita Schneiders, MD, [facility] [employer], [place]

## 2019-06-24 NOTE — Patient Instructions (Signed)

## 2019-06-27 LAB — CULTURE, OB URINE

## 2019-06-27 LAB — CYTOLOGY - PAP
Chlamydia: NEGATIVE
Comment: NEGATIVE
Comment: NEGATIVE
Comment: NEGATIVE
Comment: NORMAL
Diagnosis: NEGATIVE
High risk HPV: NEGATIVE
Neisseria Gonorrhea: NEGATIVE
Trichomonas: NEGATIVE

## 2019-06-27 LAB — URINE CULTURE, OB REFLEX

## 2019-07-04 ENCOUNTER — Encounter: Payer: Self-pay | Admitting: *Deleted

## 2019-07-05 ENCOUNTER — Encounter: Payer: Self-pay | Admitting: *Deleted

## 2019-07-08 ENCOUNTER — Other Ambulatory Visit (HOSPITAL_COMMUNITY): Payer: Self-pay | Admitting: *Deleted

## 2019-07-08 ENCOUNTER — Ambulatory Visit (HOSPITAL_BASED_OUTPATIENT_CLINIC_OR_DEPARTMENT_OTHER)
Admission: RE | Admit: 2019-07-08 | Discharge: 2019-07-08 | Disposition: A | Discharge status: Home / Self Care | Payer: Medicaid Other | Source: Ambulatory Visit | Attending: Obstetrics & Gynecology | Admitting: Obstetrics & Gynecology

## 2019-07-08 ENCOUNTER — Ambulatory Visit (HOSPITAL_COMMUNITY)
Admission: RE | Admit: 2019-07-08 | Discharge: 2019-07-08 | Disposition: A | Discharge status: Home / Self Care | Payer: Medicaid Other | Source: Ambulatory Visit | Attending: Obstetrics & Gynecology | Admitting: Obstetrics & Gynecology

## 2019-07-08 ENCOUNTER — Ambulatory Visit (HOSPITAL_COMMUNITY): Payer: Medicaid Other

## 2019-07-08 ENCOUNTER — Other Ambulatory Visit (HOSPITAL_COMMUNITY): Payer: Medicaid Other

## 2019-07-08 ENCOUNTER — Encounter (HOSPITAL_COMMUNITY): Payer: Self-pay | Admitting: *Deleted

## 2019-07-08 ENCOUNTER — Ambulatory Visit (HOSPITAL_COMMUNITY): Payer: Medicaid Other | Admitting: *Deleted

## 2019-07-08 ENCOUNTER — Other Ambulatory Visit: Payer: Self-pay

## 2019-07-08 VITALS — BP 112/71 | HR 98 | Temp 97.3°F

## 2019-07-08 DIAGNOSIS — Z362 Encounter for other antenatal screening follow-up: Secondary | ICD-10-CM

## 2019-07-08 DIAGNOSIS — O09299 Supervision of pregnancy with other poor reproductive or obstetric history, unspecified trimester: Secondary | ICD-10-CM | POA: Insufficient documentation

## 2019-07-08 DIAGNOSIS — Z3480 Encounter for supervision of other normal pregnancy, unspecified trimester: Secondary | ICD-10-CM | POA: Insufficient documentation

## 2019-07-08 DIAGNOSIS — Z3A21 21 weeks gestation of pregnancy: Secondary | ICD-10-CM

## 2019-07-08 DIAGNOSIS — O350XX0 Maternal care for (suspected) central nervous system malformation in fetus, not applicable or unspecified: Secondary | ICD-10-CM | POA: Diagnosis present

## 2019-07-08 DIAGNOSIS — O3506X0 Maternal care for (suspected) central nervous system malformation or damage in fetus, hydrocephaly, not applicable or unspecified: Secondary | ICD-10-CM

## 2019-07-08 DIAGNOSIS — O09292 Supervision of pregnancy with other poor reproductive or obstetric history, second trimester: Secondary | ICD-10-CM

## 2019-07-08 DIAGNOSIS — IMO0002 Reserved for concepts with insufficient information to code with codable children: Secondary | ICD-10-CM

## 2019-07-13 LAB — SMN1 COPY NUMBER ANALYSIS (SMA CARRIER SCREENING)

## 2019-07-13 LAB — SPECIMEN STATUS REPORT

## 2019-07-16 LAB — OBSTETRIC PANEL, INCLUDING HIV
Antibody Screen: NEGATIVE
Basophils Absolute: 0.1 10*3/uL (ref 0.0–0.2)
Basos: 1 %
EOS (ABSOLUTE): 0 10*3/uL (ref 0.0–0.4)
Eos: 0 %
HIV Screen 4th Generation wRfx: NONREACTIVE
Hematocrit: 34.4 % (ref 34.0–46.6)
Hemoglobin: 11.5 g/dL (ref 11.1–15.9)
Hepatitis B Surface Ag: NEGATIVE
Immature Grans (Abs): 0.1 10*3/uL (ref 0.0–0.1)
Immature Granulocytes: 1 %
Lymphocytes Absolute: 1.7 10*3/uL (ref 0.7–3.1)
Lymphs: 17 %
MCH: 27.4 pg (ref 26.6–33.0)
MCHC: 33.4 g/dL (ref 31.5–35.7)
MCV: 82 fL (ref 79–97)
Monocytes Absolute: 0.7 10*3/uL (ref 0.1–0.9)
Monocytes: 7 %
Neutrophils Absolute: 7.6 10*3/uL — ABNORMAL HIGH (ref 1.4–7.0)
Neutrophils: 74 %
Platelets: 349 10*3/uL (ref 150–450)
RBC: 4.19 x10E6/uL (ref 3.77–5.28)
RDW: 13 % (ref 11.7–15.4)
RPR Ser Ql: NONREACTIVE
Rh Factor: POSITIVE
Rubella Antibodies, IGG: 1.82 index (ref >0.99)
WBC: 10.1 10*3/uL (ref 3.4–10.8)

## 2019-07-16 LAB — COMPREHENSIVE METABOLIC PANEL
ALT: 7 IU/L (ref 0–32)
AST: 13 IU/L (ref 0–40)
Albumin/Globulin Ratio: 1.1 — ABNORMAL LOW (ref 1.2–2.2)
Albumin: 3.6 g/dL — ABNORMAL LOW (ref 3.9–5.0)
Alkaline Phosphatase: 83 IU/L (ref 39–117)
BUN/Creatinine Ratio: 10 (ref 9–23)
BUN: 6 mg/dL (ref 6–20)
Bilirubin Total: 0.2 mg/dL (ref 0.0–1.2)
CO2: 20 mmol/L (ref 20–29)
Calcium: 9.5 mg/dL (ref 8.7–10.2)
Chloride: 104 mmol/L (ref 96–106)
Creatinine, Ser: 0.59 mg/dL (ref 0.57–1.00)
GFR calc Af Amer: 142 mL/min/{1.73_m2} (ref >59)
GFR calc non Af Amer: 123 mL/min/{1.73_m2} (ref >59)
Globulin, Total: 3.2 g/dL (ref 1.5–4.5)
Glucose: 79 mg/dL (ref 65–99)
Potassium: 4 mmol/L (ref 3.5–5.2)
Sodium: 138 mmol/L (ref 134–144)
Total Protein: 6.8 g/dL (ref 6.0–8.5)

## 2019-07-16 LAB — AFP, SERUM, OPEN SPINA BIFIDA
AFP MoM: 1.41
AFP Value: 53.1 ng/mL
Gest. Age on Collection Date: 18 weeks
Maternal Age At EDD: 31.3 yr
OSBR Risk 1 IN: 3501
Test Results:: NEGATIVE
Weight: 189 [lb_av]

## 2019-07-16 LAB — HEMOGLOBIN A1C

## 2019-07-16 LAB — HEMOGLOBINOPATHY EVALUATION
Ferritin: 27 ng/mL (ref 15–150)
Hgb A2 Quant: 2 % (ref 1.8–3.2)
Hgb A: 98 % (ref 96.4–98.8)
Hgb C: 0 %
Hgb F Quant: 0 % (ref 0.0–2.0)
Hgb S: 0 %
Hgb Solubility: NEGATIVE
Hgb Variant: 0 %

## 2019-07-16 LAB — HEPATITIS C ANTIBODY: Hep C Virus Ab: <0.1 s/co ratio (ref 0.0–0.9)

## 2019-07-16 LAB — SPECIMEN STATUS REPORT

## 2019-07-22 ENCOUNTER — Other Ambulatory Visit: Payer: Self-pay

## 2019-07-22 ENCOUNTER — Telehealth (INDEPENDENT_AMBULATORY_CARE_PROVIDER_SITE_OTHER): Payer: Medicaid Other | Admitting: Obstetrics & Gynecology

## 2019-07-22 ENCOUNTER — Encounter: Payer: Self-pay | Admitting: Obstetrics & Gynecology

## 2019-07-22 DIAGNOSIS — Z87898 Personal history of other specified conditions: Secondary | ICD-10-CM | POA: Diagnosis not present

## 2019-07-22 DIAGNOSIS — Z3480 Encounter for supervision of other normal pregnancy, unspecified trimester: Secondary | ICD-10-CM | POA: Diagnosis not present

## 2019-07-22 DIAGNOSIS — Z98891 History of uterine scar from previous surgery: Secondary | ICD-10-CM

## 2019-07-22 DIAGNOSIS — F411 Generalized anxiety disorder: Secondary | ICD-10-CM

## 2019-07-22 NOTE — Progress Notes (Signed)
Pt states she cannot find BP cuff so she has not set up babyscripts. Pt is aware she can come to office to get Rx for cuff and [facility]. Pt will call when she can come pick up the Rx.    PRENATAL VISIT NOTE I connected with@ on 07/22/19 at  9:50 AM EDT by: telephone and verified that I am speaking with the correct person using two identifiers.  Patient is located at home and provider is located at [place].     The purpose of this virtual visit is to provide medical care while limiting exposure to the novel coronavirus. I discussed the limitations, risks, security and privacy concerns of performing an evaluation and management service by telephone and the availability of in person appointments. I also discussed with the patient that there may be a patient responsible charge related to this service. By engaging in this virtual visit, you consent to the provision of healthcare.  Additionally, you authorize for your insurance to be billed for the services provided during this visit.  The patient expressed understanding and agreed to proceed.  The following staff members participated in the virtual visit:  Deanna Sola Subjective:  Teresa Reed is a 29 y.o. [phone] at [redacted]w[redacted]d being seen today for ongoing prenatal care.  She is currently monitored for the following issues for this high-risk pregnancy and has Supervision of other normal pregnancy, antepartum; History of seizure; GAD (generalized anxiety disorder); History of classical cesarean section; History of fetal hydrocephalus in previous pregnancy, currently pregnant; and Short interval between pregnancies affecting pregnancy, antepartum on their problem list.  Patient reports no complaints.  Contractions: Not present. Vag. Bleeding: None.  Movement: Present. Denies leaking of fluid.   The following portions of the patient's history were reviewed and updated as appropriate: allergies, current medications, past family history,  past medical history, past social history, past surgical history and problem list.   Objective:  There were no vitals filed for this visit.  Fetal Status:     Movement: Present     No exam due to telephone visit.  Assessment and Plan:  Pregnancy: [id number] at [redacted]w[redacted]d 1. History of classical cesarean section Rpt 36-37 weeks  2. Supervision of other normal pregnancy, antepartum F/U US in early April for completion of anatomy Pt coming by to pick up RX for BP cuff.  3. GAD (generalized anxiety disorder) Reports none  4. History of seizure None reported.  5. Undesired Fertility Needs BTL consent  Preterm labor symptoms and general obstetric precautions including but not limited to vaginal bleeding, contractions, leaking of fluid and fetal movement were reviewed in detail with the patient. Please refer to After Visit Summary for other counseling recommendations.    Future Appointments  Date Time Provider Department Center  08/06/2019 12:30 PM [facility] NURSE [facility]  08/06/2019 12:30 PM [facility] [place] 1 [facility]    Elsie Lincoln, MD

## 2019-08-06 ENCOUNTER — Ambulatory Visit (HOSPITAL_COMMUNITY): Payer: Medicaid Other | Admitting: *Deleted

## 2019-08-06 ENCOUNTER — Ambulatory Visit (HOSPITAL_COMMUNITY)
Admission: RE | Admit: 2019-08-06 | Discharge: 2019-08-06 | Disposition: A | Discharge status: Home / Self Care | Payer: Medicaid Other | Source: Ambulatory Visit | Attending: Obstetrics and Gynecology | Admitting: Obstetrics and Gynecology

## 2019-08-06 ENCOUNTER — Encounter (HOSPITAL_COMMUNITY): Payer: Self-pay

## 2019-08-06 ENCOUNTER — Other Ambulatory Visit: Payer: Self-pay

## 2019-08-06 VITALS — BP 101/70 | HR 92 | Temp 97.8°F

## 2019-08-06 DIAGNOSIS — Z362 Encounter for other antenatal screening follow-up: Secondary | ICD-10-CM | POA: Diagnosis present

## 2019-08-06 DIAGNOSIS — O0990 Supervision of high risk pregnancy, unspecified, unspecified trimester: Secondary | ICD-10-CM

## 2019-08-06 DIAGNOSIS — O09292 Supervision of pregnancy with other poor reproductive or obstetric history, second trimester: Secondary | ICD-10-CM | POA: Diagnosis not present

## 2019-08-06 DIAGNOSIS — O34212 Maternal care for vertical scar from previous cesarean delivery: Secondary | ICD-10-CM | POA: Diagnosis not present

## 2019-08-06 DIAGNOSIS — Z3A25 25 weeks gestation of pregnancy: Secondary | ICD-10-CM | POA: Diagnosis not present

## 2019-08-06 DIAGNOSIS — O09892 Supervision of other high risk pregnancies, second trimester: Secondary | ICD-10-CM

## 2019-08-19 ENCOUNTER — Encounter: Payer: Medicaid Other | Admitting: Obstetrics & Gynecology

## 2019-09-09 ENCOUNTER — Ambulatory Visit (INDEPENDENT_AMBULATORY_CARE_PROVIDER_SITE_OTHER): Payer: Medicaid Other | Admitting: Obstetrics & Gynecology

## 2019-09-09 ENCOUNTER — Telehealth: Payer: Self-pay | Admitting: *Deleted

## 2019-09-09 ENCOUNTER — Other Ambulatory Visit: Payer: Self-pay

## 2019-09-09 VITALS — BP 112/67 | HR 88 | Wt 198.0 lb

## 2019-09-09 DIAGNOSIS — O99343 Other mental disorders complicating pregnancy, third trimester: Secondary | ICD-10-CM

## 2019-09-09 DIAGNOSIS — Z3480 Encounter for supervision of other normal pregnancy, unspecified trimester: Secondary | ICD-10-CM

## 2019-09-09 DIAGNOSIS — F322 Major depressive disorder, single episode, severe without psychotic features: Secondary | ICD-10-CM

## 2019-09-09 DIAGNOSIS — F329 Major depressive disorder, single episode, unspecified: Secondary | ICD-10-CM

## 2019-09-09 DIAGNOSIS — Z3A30 30 weeks gestation of pregnancy: Secondary | ICD-10-CM

## 2019-09-09 MED ORDER — SERTRALINE HCL 50 MG PO TABS: 50.0000 mg | ORAL_TABLET | Freq: Every day | ORAL | 3 refills | Status: DC

## 2019-09-09 NOTE — Telephone Encounter (Signed)
Left patient an urgent message with appointment information for OB and Behavioral Health on 09/16/19 at 9:30 and 11:00 AM.
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
## 2019-09-10 ENCOUNTER — Telehealth: Payer: Self-pay | Admitting: *Deleted

## 2019-09-10 LAB — CBC
HCT: 33.3 % — ABNORMAL LOW (ref 35.0–45.0)
Hemoglobin: 10.5 g/dL — ABNORMAL LOW (ref 11.7–15.5)
MCH: 26.3 pg — ABNORMAL LOW (ref 27.0–33.0)
MCHC: 31.5 g/dL — ABNORMAL LOW (ref 32.0–36.0)
MCV: 83.5 fL (ref 80.0–100.0)
MPV: 10 fL (ref 7.5–12.5)
Platelets: 305 10*3/uL (ref 140–400)
RBC: 3.99 10*6/uL (ref 3.80–5.10)
RDW: 13.3 % (ref 11.0–15.0)
WBC: 9.3 10*3/uL (ref 3.8–10.8)

## 2019-09-10 LAB — RPR: RPR Ser Ql: NONREACTIVE

## 2019-09-10 LAB — 2HR GTT W 1 HR, CARPENTER, 75 G
Glucose, 1 Hr, Gest: 161 mg/dL (ref 65–179)
Glucose, 2 Hr, Gest: 94 mg/dL (ref 65–152)
Glucose, Fasting, Gest: 82 mg/dL (ref 65–91)

## 2019-09-10 LAB — HIV ANTIBODY (ROUTINE TESTING W REFLEX): HIV 1&2 Ab, 4th Generation: NONREACTIVE

## 2019-09-10 NOTE — BH Specialist Note (Signed)
Pt answered the phone, inquired about time of visit, and said, "ok, give me a minute". Pt was informed that if she did not arrive to the video visit within a few moments, that she would receive a phone call.   Pt did not arrive to video visit and did not answer the phone;no voicemail is set up to leave a message;  left MyChart message for patient, after two additional calls and three text invites to the virtual visit.   Integrated Behavioral Health via Telemedicine Video Visit  09/10/2019 AVERY EUSTICE [phone]    Rae Lips

## 2019-09-10 NOTE — Telephone Encounter (Signed)
Patient having severe cramping and back pain to the point where it's hard to lay down. Mariel Aloe, RN advised patient to go to [facility] for evaluation due to health issues of patient. Patient agreed.

## 2019-09-11 ENCOUNTER — Other Ambulatory Visit: Payer: Self-pay

## 2019-09-11 ENCOUNTER — Ambulatory Visit: Payer: Medicaid Other | Admitting: Clinical

## 2019-09-11 DIAGNOSIS — Z91199 Patient's noncompliance with other medical treatment and regimen due to unspecified reason: Secondary | ICD-10-CM

## 2019-09-16 ENCOUNTER — Telehealth (HOSPITAL_COMMUNITY): Payer: Medicaid Other | Admitting: Psychiatry

## 2019-09-16 ENCOUNTER — Other Ambulatory Visit: Payer: Self-pay

## 2019-09-16 ENCOUNTER — Encounter: Payer: Medicaid Other | Admitting: Obstetrics & Gynecology

## 2019-09-18 ENCOUNTER — Inpatient Hospital Stay (HOSPITAL_COMMUNITY)
Admission: AD | Admit: 2019-09-18 | Discharge: 2019-09-18 | Disposition: A | Discharge status: Home / Self Care | Payer: Medicaid Other | Attending: Family Medicine | Admitting: Family Medicine

## 2019-09-18 ENCOUNTER — Encounter (HOSPITAL_COMMUNITY): Payer: Self-pay | Admitting: Family Medicine

## 2019-09-18 DIAGNOSIS — R109 Unspecified abdominal pain: Secondary | ICD-10-CM | POA: Diagnosis not present

## 2019-09-18 DIAGNOSIS — O26893 Other specified pregnancy related conditions, third trimester: Secondary | ICD-10-CM

## 2019-09-18 DIAGNOSIS — O2343 Unspecified infection of urinary tract in pregnancy, third trimester: Secondary | ICD-10-CM | POA: Diagnosis not present

## 2019-09-18 DIAGNOSIS — Z3A31 31 weeks gestation of pregnancy: Secondary | ICD-10-CM | POA: Insufficient documentation

## 2019-09-18 DIAGNOSIS — O99343 Other mental disorders complicating pregnancy, third trimester: Secondary | ICD-10-CM | POA: Diagnosis not present

## 2019-09-18 DIAGNOSIS — F329 Major depressive disorder, single episode, unspecified: Secondary | ICD-10-CM | POA: Diagnosis not present

## 2019-09-18 DIAGNOSIS — O09293 Supervision of pregnancy with other poor reproductive or obstetric history, third trimester: Secondary | ICD-10-CM | POA: Diagnosis not present

## 2019-09-18 DIAGNOSIS — Z87891 Personal history of nicotine dependence: Secondary | ICD-10-CM | POA: Insufficient documentation

## 2019-09-18 DIAGNOSIS — R82998 Other abnormal findings in urine: Secondary | ICD-10-CM | POA: Insufficient documentation

## 2019-09-18 DIAGNOSIS — N859 Noninflammatory disorder of uterus, unspecified: Secondary | ICD-10-CM | POA: Insufficient documentation

## 2019-09-18 DIAGNOSIS — Z8759 Personal history of other complications of pregnancy, childbirth and the puerperium: Secondary | ICD-10-CM | POA: Diagnosis not present

## 2019-09-18 DIAGNOSIS — N858 Other specified noninflammatory disorders of uterus: Secondary | ICD-10-CM

## 2019-09-18 LAB — URINALYSIS, ROUTINE W REFLEX MICROSCOPIC
Bilirubin Urine: NEGATIVE
Glucose, UA: NEGATIVE mg/dL
Hgb urine dipstick: NEGATIVE
Ketones, ur: NEGATIVE mg/dL
Nitrite: NEGATIVE
Protein, ur: NEGATIVE mg/dL
Specific Gravity, Urine: 1.017 (ref 1.005–1.030)
pH: 6 (ref 5.0–8.0)

## 2019-09-18 LAB — FETAL FIBRONECTIN: Fetal Fibronectin: NEGATIVE

## 2019-09-18 MED ORDER — CEPHALEXIN 500 MG PO CAPS: 500.0000 mg | ORAL_CAPSULE | Freq: Four times a day (QID) | ORAL | 0 refills | Status: DC

## 2019-09-18 NOTE — Discharge Instructions (Signed)
Back Pain in Pregnancy Back pain during pregnancy is common. Back pain may be caused by several factors that are related to changes during your pregnancy. Follow these instructions at home: Managing pain, stiffness, and swelling      If directed, for sudden (acute) back pain, put ice on the painful area. ? Put ice in a plastic bag. ? Place a towel between your skin and the bag. ? Leave the ice on for 20 minutes, 2-3 times per day.  If directed, apply heat to the affected area before you exercise. Use the heat source that your health care provider recommends, such as a moist heat pack or a heating pad. ? Place a towel between your skin and the heat source. ? Leave the heat on for 20-30 minutes. ? Remove the heat if your skin turns bright red. This is especially important if you are unable to feel pain, heat, or cold. You may have a greater risk of getting burned.  If directed, massage the affected area. Activity  Exercise as told by your health care provider. Gentle exercise is the best way to prevent or manage back pain.  Listen to your body when lifting. If lifting hurts, ask for help or bend your knees. This uses your leg muscles instead of your back muscles.  Squat down when picking up something from the floor. Do not bend over.  Only use bed rest for short periods as told by your health care provider. Bed rest should only be used for the most severe episodes of back pain. Standing, sitting, and lying down  Do not stand in one place for long periods of time.  Use good posture when sitting. Make sure your head rests over your shoulders and is not hanging forward. Use a pillow on your lower back if necessary.  Try sleeping on your side, preferably the left side, with a pregnancy support pillow or 1-2 regular pillows between your legs. ? If you have back pain after a night's rest, your bed may be too soft. ? A firm mattress may provide more support for your back during  pregnancy. General instructions  Do not wear high heels.  Eat a healthy diet. Try to gain weight within your health care provider's recommendations.  Use a maternity girdle, elastic sling, or back brace as told by your health care provider.  Take over-the-counter and prescription medicines only as told by your health care provider.  Work with a physical therapist or massage therapist to find ways to manage back pain. Acupuncture or massage therapy may be helpful.  Keep all follow-up visits as told by your health care provider. This is important. Contact a health care provider if:  Your back pain interferes with your daily activities.  You have increasing pain in other parts of your body. Get help right away if:  You develop numbness, tingling, weakness, or problems with the use of your arms or legs.  You develop severe back pain that is not controlled with medicine.  You have a change in bowel or bladder control.  You develop shortness of breath, dizziness, or you faint.  You develop nausea, vomiting, or sweating.  You have back pain that is a rhythmic, cramping pain similar to labor pains. Labor pain is usually 1-2 minutes apart, lasts for about 1 minute, and involves a bearing down feeling or pressure in your pelvis.  You have back pain and your water breaks or you have vaginal bleeding.  You have back pain or numbness  that travels down your leg.  Your back pain developed after you fell.  You develop pain on one side of your back.  You see blood in your urine.  You develop skin blisters in the area of your back pain. Summary  Back pain may be caused by several factors that are related to changes during your pregnancy.  Follow instructions as told by your health care provider for managing pain, stiffness, and swelling.  Exercise as told by your health care provider. Gentle exercise is the best way to prevent or manage back pain.  Take over-the-counter and  prescription medicines only as told by your health care provider.  Keep all follow-up visits as told by your health care provider. This is important. This information is not intended to replace advice given to you by your health care provider. Make sure you discuss any questions you have with your health care provider. Document Revised: 08/07/2018 Document Reviewed: 10/04/2017 Elsevier Patient Education  [place]. Pregnancy and Urinary Tract Infection  A urinary tract infection (UTI) is an infection of any part of the urinary tract. This includes the kidneys, the tubes that connect your kidneys to your bladder (ureters), the bladder, and the tube that carries urine out of your body (urethra). These organs make, store, and get rid of urine in the body. Your health care provider may use other names to describe the infection. An upper UTI affects the ureters and kidneys (pyelonephritis). A lower UTI affects the bladder (cystitis) and urethra (urethritis). Most urinary tract infections are caused by bacteria in your genital area, around the entrance to your urinary tract (urethra). These bacteria grow and cause irritation and inflammation of your urinary tract. You are more likely to develop a UTI during pregnancy because the physical and hormonal changes your body goes through can make it easier for bacteria to get into your urinary tract. Your growing baby also puts pressure on your bladder and can affect urine flow. It is important to recognize and treat UTIs in pregnancy because of the risk of serious complications for both you and your baby. How does this affect me? Symptoms of a UTI include:  Needing to urinate right away (urgently).  Frequent urination or passing small amounts of urine frequently.  Pain or burning with urination.  Blood in the urine.  Urine that smells bad or unusual.  Trouble urinating.  Cloudy urine.  Pain in the abdomen or lower back.  Vaginal  discharge. You may also have:  Vomiting or a decreased appetite.  Confusion.  Irritability or tiredness.  A fever.  Diarrhea. How does this affect my baby? An untreated UTI during pregnancy could lead to a kidney infection or a systemic infection, which can cause health problems that could affect your baby. Possible complications of an untreated UTI include:  Giving birth to your baby before 37 weeks of pregnancy (premature).  Having a baby with a low birth weight.  Developing high blood pressure during pregnancy (preeclampsia).  Having a low hemoglobin level (anemia). What can I do to lower my risk? To prevent a UTI:  Go to the bathroom as soon as you feel the need. Do not hold urine for long periods of time.  Always wipe from front to back, especially after a bowel movement. Use each tissue one time when you wipe.  Empty your bladder after sex.  Keep your genital area dry.  Drink 6-10 glasses of water each day.  Do not douche or use deodorant sprays.  How is this treated? Treatment for this condition may include:  Antibiotic medicines that are safe to take during pregnancy.  Other medicines to treat less common causes of UTI. Follow these instructions at home:  If you were prescribed an antibiotic medicine, take it as told by your health care provider. Do not stop using the antibiotic even if you start to feel better.  Keep all follow-up visits as told by your health care provider. This is important. Contact a health care provider if:  Your symptoms do not improve or they get worse.  You have abnormal vaginal discharge. Get help right away if you:  Have a fever.  Have nausea and vomiting.  Have back or side pain.  Feel contractions in your uterus.  Have lower belly pain.  Have a gush of fluid from your vagina.  Have blood in your urine. Summary  A urinary tract infection (UTI) is an infection of any part of the urinary tract, which includes the  kidneys, ureters, bladder, and urethra.  Most urinary tract infections are caused by bacteria in your genital area, around the entrance to your urinary tract (urethra).  You are more likely to develop a UTI during pregnancy.  If you were prescribed an antibiotic medicine, take it as told by your health care provider. Do not stop using the antibiotic even if you start to feel better. This information is not intended to replace advice given to you by your health care provider. Make sure you discuss any questions you have with your health care provider. Document Revised: 08/10/2018 Document Reviewed: 03/22/2018 Elsevier Patient Education  2020 [employer].
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
## 2019-09-18 NOTE — MAU Note (Signed)
PT SAYS SHE HAS HAD PAIN IN ABD SINCE LAST WEEK.  BABY BALLS UP A LOT.  PT TOLD DR LEGGETT  LAST WEEK. PT SAYS SHE HAS BAD NERVES. UNSURE IF HAVING UC.

## 2019-09-19 LAB — CULTURE, OB URINE: Culture: <10000 — AB

## 2019-09-26 ENCOUNTER — Other Ambulatory Visit: Payer: Self-pay

## 2019-09-26 ENCOUNTER — Encounter: Payer: Medicaid Other | Admitting: Obstetrics & Gynecology

## 2019-10-14 ENCOUNTER — Other Ambulatory Visit: Payer: Self-pay

## 2019-10-14 ENCOUNTER — Encounter: Payer: Medicaid Other | Admitting: Obstetrics and Gynecology

## 2019-10-14 ENCOUNTER — Encounter (HOSPITAL_COMMUNITY): Payer: Self-pay | Admitting: Obstetrics & Gynecology

## 2019-10-14 ENCOUNTER — Telehealth: Payer: Self-pay | Admitting: *Deleted

## 2019-10-14 ENCOUNTER — Inpatient Hospital Stay (HOSPITAL_COMMUNITY)
Admission: AD | Admit: 2019-10-14 | Discharge: 2019-10-14 | Disposition: A | Discharge status: Home / Self Care | Payer: Medicaid Other | Attending: Obstetrics & Gynecology | Admitting: Obstetrics & Gynecology

## 2019-10-14 DIAGNOSIS — Z0371 Encounter for suspected problem with amniotic cavity and membrane ruled out: Secondary | ICD-10-CM | POA: Diagnosis not present

## 2019-10-14 DIAGNOSIS — Z3A35 35 weeks gestation of pregnancy: Secondary | ICD-10-CM | POA: Insufficient documentation

## 2019-10-14 DIAGNOSIS — Z3493 Encounter for supervision of normal pregnancy, unspecified, third trimester: Secondary | ICD-10-CM | POA: Diagnosis not present

## 2019-10-14 LAB — POCT FERN TEST: POCT Fern Test: NEGATIVE

## 2019-10-14 NOTE — MAU Provider Note (Signed)
First Provider Initiated Contact with Patient 10/14/19 1549       S: Ms. Teresa Reed is a 29 y.o. [id number] at [redacted]w[redacted]d  who presents to MAU today complaining of leaking of fluid since this afternoon. She denies vaginal bleeding. She denies contractions. She reports normal fetal movement.    O: BP 116/63 (BP Location: Right Arm)   Pulse (!) 104   Temp 98.3 F (36.8 C) (Oral)   Resp 20   Ht 5\' 2"  (1.575 m)   Wt 91.3 kg   LMP 02/18/2019   SpO2 99%   BMI 36.82 kg/m  GENERAL: Well-developed, well-nourished female in no acute distress.  HEAD: Normocephalic, atraumatic.  CHEST: Normal effort of breathing, regular heart rate ABDOMEN: Soft, nontender, gravid PELVIC: Normal external female genitalia. Vagina is pink and rugated. Cervix with normal contour, no lesions. Normal discharge.  No pooling.   Cervical exam: deferred     Fetal Monitoring: Baseline: 145 Variability: moderate Accelerations: 15x15 Decelerations: none Contractions: none  Results for orders placed or performed during the hospital encounter of 10/14/19 (from the past 24 hour(s))  POCT fern test     Status: Normal   Collection Time: 10/14/19  3:59 PM  Result Value Ref Range   POCT Fern Test Negative = intact amniotic membranes      A: SIUP at [redacted]w[redacted]d  Membranes intact  P: Discharge home in stable condition  [redacted]w[redacted]d, NP 10/14/2019 4:08 PM

## 2019-10-14 NOTE — Discharge Instructions (Signed)
Fetal Movement Counts Patient Name: ________________________________________________ Patient Due Date: ____________________ What is a fetal movement count?  A fetal movement count is the number of times that you feel your baby move during a certain amount of time. This may also be called a fetal kick count. A fetal movement count is recommended for every pregnant woman. You may be asked to start counting fetal movements as early as week 28 of your pregnancy. Pay attention to when your baby is most active. You may notice your baby's sleep and wake cycles. You may also notice things that make your baby move more. You should do a fetal movement count:  When your baby is normally most active.  At the same time each day. A good time to count movements is while you are resting, after having something to eat and drink. How do I count fetal movements? 1. Find a quiet, comfortable area. Sit, or lie down on your side. 2. Write down the date, the start time and stop time, and the number of movements that you felt between those two times. Take this information with you to your health care visits. 3. Write down your start time when you feel the first movement. 4. Count kicks, flutters, swishes, rolls, and jabs. You should feel at least 10 movements. 5. You may stop counting after you have felt 10 movements, or if you have been counting for 2 hours. Write down the stop time. 6. If you do not feel 10 movements in 2 hours, contact your health care provider for further instructions. Your health care provider may want to do additional tests to assess your baby's well-being. Contact a health care provider if:  You feel fewer than 10 movements in 2 hours.  Your baby is not moving like he or she usually does. Date: ____________ Start time: ____________ Stop time: ____________ Movements: ____________ Date: ____________ Start time: ____________ Stop time: ____________ Movements: ____________ Date: ____________  Start time: ____________ Stop time: ____________ Movements: ____________ Date: ____________ Start time: ____________ Stop time: ____________ Movements: ____________ Date: ____________ Start time: ____________ Stop time: ____________ Movements: ____________ Date: ____________ Start time: ____________ Stop time: ____________ Movements: ____________ Date: ____________ Start time: ____________ Stop time: ____________ Movements: ____________ Date: ____________ Start time: ____________ Stop time: ____________ Movements: ____________ Date: ____________ Start time: ____________ Stop time: ____________ Movements: ____________ This information is not intended to replace advice given to you by your health care provider. Make sure you discuss any questions you have with your health care provider. Document Revised: 12/06/2018 Document Reviewed: 12/06/2018 Elsevier Patient Education  2020 Elsevier Inc.        Signs and Symptoms of Labor Labor is your body's natural process of moving your baby, placenta, and umbilical cord out of your uterus. The process of labor usually starts when your baby is full-term, between 37 and 40 weeks of pregnancy. How will I know when I am close to going into labor? As your body prepares for labor and the birth of your baby, you may notice the following symptoms in the weeks and days before true labor starts:  Having a strong desire to get your home ready to receive your new baby. This is called nesting. Nesting may be a sign that labor is approaching, and it may occur several weeks before birth. Nesting may involve cleaning and organizing your home.  Passing a small amount of thick, bloody mucus out of your vagina (normal bloody show or losing your mucus plug). This may happen more than a   week before labor begins, or it might occur right before labor begins as the opening of the cervix starts to widen (dilate). For some women, the entire mucus plug passes at once. For others,  smaller portions of the mucus plug may gradually pass over several days.  Your baby moving (dropping) lower in your pelvis to get into position for birth (lightening). When this happens, you may feel more pressure on your bladder and pelvic bone and less pressure on your ribs. This may make it easier to breathe. It may also cause you to need to urinate more often and have problems with bowel movements.  Having "practice contractions" (Braxton Hicks contractions) that occur at irregular (unevenly spaced) intervals that are more than 10 minutes apart. This is also called false labor. False labor contractions are common after exercise or sexual activity, and they will stop if you change position, rest, or drink fluids. These contractions are usually mild and do not get stronger over time. They may feel like: ? A backache or back pain. ? Mild cramps, similar to menstrual cramps. ? Tightening or pressure in your abdomen. Other early symptoms that labor may be starting soon include:  Nausea or loss of appetite.  Diarrhea.  Having a sudden burst of energy, or feeling very tired.  Mood changes.  Having trouble sleeping. How will I know when labor has begun? Signs that true labor has begun may include:  Having contractions that come at regular (evenly spaced) intervals and increase in intensity. This may feel like more intense tightening or pressure in your abdomen that moves to your back. ? Contractions may also feel like rhythmic pain in your upper thighs or back that comes and goes at regular intervals. ? For first-time mothers, this change in intensity of contractions often occurs at a more gradual pace. ? Women who have given birth before may notice a more rapid progression of contraction changes.  Having a feeling of pressure in the vaginal area.  Your water breaking (rupture of membranes). This is when the sac of fluid that surrounds your baby breaks. When this happens, you will notice  fluid leaking from your vagina. This may be clear or blood-tinged. Labor usually starts within 24 hours of your water breaking, but it may take longer to begin. ? Some women notice this as a gush of fluid. ? Others notice that their underwear repeatedly becomes damp. Follow these instructions at home:   When labor starts, or if your water breaks, call your health care provider or nurse care line. Based on your situation, they will determine when you should go in for an exam.  When you are in early labor, you may be able to rest and manage symptoms at home. Some strategies to try at home include: ? Breathing and relaxation techniques. ? Taking a warm bath or shower. ? Listening to music. ? Using a heating pad on the lower back for pain. If you are directed to use heat:  Place a towel between your skin and the heat source.  Leave the heat on for 20-30 minutes.  Remove the heat if your skin turns bright red. This is especially important if you are unable to feel pain, heat, or cold. You may have a greater risk of getting burned. Get help right away if:  You have painful, regular contractions that are 5 minutes apart or less.  Labor starts before you are [redacted] weeks along in your pregnancy.  You have a fever.  You have   a headache that does not go away.  You have bright red blood coming from your vagina.  You do not feel your baby moving.  You have a sudden onset of: ? Severe headache with vision problems. ? Nausea, vomiting, or diarrhea. ? Chest pain or shortness of breath. These symptoms may be an emergency. If your health care provider recommends that you go to the hospital or birth center where you plan to deliver, do not drive yourself. Have someone else drive you, or call emergency services (911 in the [place] Summary  Labor is your body's natural process of moving your baby, placenta, and umbilical cord out of your uterus.  The process of labor usually starts when your baby is  full-term, between 37 and 40 weeks of pregnancy.  When labor starts, or if your water breaks, call your health care provider or nurse care line. Based on your situation, they will determine when you should go in for an exam. This information is not intended to replace advice given to you by your health care provider. Make sure you discuss any questions you have with your health care provider. Document Revised: 01/16/2017 Document Reviewed: 09/23/2016 Elsevier Patient Education  2020 Elsevier Inc.  

## 2019-10-14 NOTE — Telephone Encounter (Signed)
Pt called stating that she was talking with her neighbor when she had a gush of fluid trinkle down her leg.  She had 2 episodes of that this morning.  She said she is pretty sure she did not pee on herself.  She denies any odor.  She is [redacted]w[redacted]d.  I recommended to go to MAU for evaluation since she is in [place] anyway.  Pt voices understanding and states she will go on over to hospital.

## 2019-10-14 NOTE — MAU Note (Signed)
Presents with c/o LOF since 1000 this morning.  Reports fluid is clear and has continued to leak, wearing pad.  Denies ctxs or VB.  Endorses +FM.

## 2019-10-15 NOTE — Patient Instructions (Signed)
SHAN PADGETT  10/15/2019   Your procedure is scheduled on:  10/25/2019  Arrive at 0745 at Entrance C on [address] at [facility]  and [address]. You are invited to use the FREE valet parking or use the Visitor's parking deck.  Pick up the phone at the desk and dial [phone].  Call this number if you have problems the morning of surgery: [phone]  Remember:   Do not eat food:(After Midnight) Desps de medianoche.  Do not drink clear liquids: (After Midnight) Desps de medianoche.  Take these medicines the morning of surgery with A SIP OF WATER:  Take your zoloft as prescribed   Do not wear jewelry, make-up or nail polish.  Do not wear lotions, powders, or perfumes. Do not wear deodorant.  Do not shave 48 hours prior to surgery.  Do not bring valuables to the hospital.  [facility] is not   responsible for any belongings or valuables brought to the hospital.  Contacts, dentures or bridgework may not be worn into surgery.  Leave suitcase in the car. After surgery it may be brought to your room.  For patients admitted to the hospital, checkout time is 11:00 AM the day of              discharge.      Please read over the following fact sheets that you were given:     Preparing for Surgery

## 2019-10-16 ENCOUNTER — Encounter (HOSPITAL_COMMUNITY): Payer: Self-pay

## 2019-10-21 ENCOUNTER — Encounter: Payer: Medicaid Other | Admitting: Obstetrics & Gynecology

## 2019-10-23 ENCOUNTER — Other Ambulatory Visit (HOSPITAL_COMMUNITY)
Admission: RE | Admit: 2019-10-23 | Discharge: 2019-10-23 | Disposition: A | Discharge status: Home / Self Care | Payer: Medicaid Other | Source: Ambulatory Visit | Attending: Obstetrics and Gynecology | Admitting: Obstetrics and Gynecology

## 2019-10-24 ENCOUNTER — Inpatient Hospital Stay (HOSPITAL_COMMUNITY)
Admission: AD | Admit: 2019-10-24 | Discharge: 2019-10-24 | Disposition: A | Discharge status: Home / Self Care | Payer: Medicaid Other | Source: Home / Self Care | Attending: Obstetrics and Gynecology | Admitting: Obstetrics and Gynecology

## 2019-10-24 ENCOUNTER — Encounter (HOSPITAL_COMMUNITY): Payer: Self-pay | Admitting: Obstetrics & Gynecology

## 2019-10-24 ENCOUNTER — Inpatient Hospital Stay (HOSPITAL_COMMUNITY)
Admission: AD | Admit: 2019-10-24 | Discharge: 2019-10-24 | Disposition: A | Discharge status: Home / Self Care | Payer: Self-pay | Attending: Obstetrics & Gynecology | Admitting: Obstetrics & Gynecology

## 2019-10-24 DIAGNOSIS — Z01812 Encounter for preprocedural laboratory examination: Secondary | ICD-10-CM | POA: Insufficient documentation

## 2019-10-24 DIAGNOSIS — Z20822 Contact with and (suspected) exposure to covid-19: Secondary | ICD-10-CM | POA: Insufficient documentation

## 2019-10-24 LAB — CBC
HCT: 32.8 % — ABNORMAL LOW (ref 36.0–46.0)
Hemoglobin: 10.4 g/dL — ABNORMAL LOW (ref 12.0–15.0)
MCH: 26.3 pg (ref 26.0–34.0)
MCHC: 31.7 g/dL (ref 30.0–36.0)
MCV: 83 fL (ref 80.0–100.0)
Platelets: 295 10*3/uL (ref 150–400)
RBC: 3.95 MIL/uL (ref 3.87–5.11)
RDW: 14.3 % (ref 11.5–15.5)
WBC: 9.6 10*3/uL (ref 4.0–10.5)
nRBC: 0 % (ref 0.0–0.2)

## 2019-10-24 LAB — SARS CORONAVIRUS 2 (TAT 6-24 HRS): SARS Coronavirus 2: NEGATIVE

## 2019-10-24 LAB — TYPE AND SCREEN
ABO/RH(D): O POS
Antibody Screen: NEGATIVE

## 2019-10-24 LAB — ABO/RH: ABO/RH(D): O POS

## 2019-10-24 NOTE — MAU Note (Signed)
Pt was to have come for labwork yesterday, called "didn't feel like coming", so came in as instructed.  Denies any problems.  Lab here for blood work.  Instructions and body wash given to pt.

## 2019-10-25 ENCOUNTER — Inpatient Hospital Stay (HOSPITAL_COMMUNITY): Payer: Medicaid Other | Admitting: Anesthesiology

## 2019-10-25 ENCOUNTER — Other Ambulatory Visit: Payer: Self-pay

## 2019-10-25 ENCOUNTER — Inpatient Hospital Stay (HOSPITAL_COMMUNITY)
Admit: 2019-10-25 | Discharge: 2019-10-27 | DRG: 785 | Disposition: A | Discharge status: Home / Self Care | Payer: Medicaid Other | Attending: Obstetrics & Gynecology | Admitting: Obstetrics & Gynecology

## 2019-10-25 ENCOUNTER — Encounter (HOSPITAL_COMMUNITY): Payer: Self-pay | Admitting: Obstetrics & Gynecology

## 2019-10-25 ENCOUNTER — Encounter (HOSPITAL_COMMUNITY)
Disposition: A | Discharge status: Home / Self Care | Payer: Self-pay | Source: Home / Self Care | Attending: Obstetrics & Gynecology

## 2019-10-25 DIAGNOSIS — Z302 Encounter for sterilization: Secondary | ICD-10-CM

## 2019-10-25 DIAGNOSIS — Z3A37 37 weeks gestation of pregnancy: Secondary | ICD-10-CM

## 2019-10-25 DIAGNOSIS — F411 Generalized anxiety disorder: Secondary | ICD-10-CM | POA: Diagnosis present

## 2019-10-25 DIAGNOSIS — O34212 Maternal care for vertical scar from previous cesarean delivery: Secondary | ICD-10-CM | POA: Diagnosis present

## 2019-10-25 DIAGNOSIS — O99344 Other mental disorders complicating childbirth: Secondary | ICD-10-CM | POA: Diagnosis present

## 2019-10-25 DIAGNOSIS — Z20822 Contact with and (suspected) exposure to covid-19: Secondary | ICD-10-CM | POA: Diagnosis present

## 2019-10-25 DIAGNOSIS — O09899 Supervision of other high risk pregnancies, unspecified trimester: Secondary | ICD-10-CM

## 2019-10-25 DIAGNOSIS — O09299 Supervision of pregnancy with other poor reproductive or obstetric history, unspecified trimester: Secondary | ICD-10-CM

## 2019-10-25 DIAGNOSIS — Z87891 Personal history of nicotine dependence: Secondary | ICD-10-CM | POA: Diagnosis not present

## 2019-10-25 DIAGNOSIS — M797 Fibromyalgia: Secondary | ICD-10-CM | POA: Diagnosis present

## 2019-10-25 DIAGNOSIS — Z87898 Personal history of other specified conditions: Secondary | ICD-10-CM

## 2019-10-25 DIAGNOSIS — Z98891 History of uterine scar from previous surgery: Secondary | ICD-10-CM

## 2019-10-25 LAB — RPR: RPR Ser Ql: NONREACTIVE

## 2019-10-25 SURGERY — Surgical Case
Anesthesia: Spinal | Laterality: Bilateral

## 2019-10-25 MED ORDER — EPHEDRINE SULFATE-NACL 50-0.9 MG/10ML-% IV SOSY
PREFILLED_SYRINGE | INTRAVENOUS | Status: DC | PRN
  Administered 2019-10-25: 10 mg via INTRAVENOUS

## 2019-10-25 MED ORDER — SODIUM CHLORIDE 0.9 % IR SOLN
Status: DC | PRN
  Administered 2019-10-25: 1000 mL

## 2019-10-25 MED ORDER — SENNOSIDES-DOCUSATE SODIUM 8.6-50 MG PO TABS
2.0000 | ORAL_TABLET | ORAL | Status: DC
  Administered 2019-10-26 – 2019-10-27 (×2): 2 via ORAL
  Filled 2019-10-25 (×2): qty 2

## 2019-10-25 MED ORDER — TRAMADOL HCL 50 MG PO TABS
50.0000 mg | ORAL_TABLET | Freq: Four times a day (QID) | ORAL | Status: DC | PRN
  Administered 2019-10-27: 50 mg via ORAL
  Filled 2019-10-25: qty 1

## 2019-10-25 MED ORDER — LACTATED RINGERS IV SOLN: INTRAVENOUS | Status: DC | PRN

## 2019-10-25 MED ORDER — MEPERIDINE HCL 25 MG/ML IJ SOLN: 6.2500 mg | INTRAMUSCULAR | Status: DC | PRN

## 2019-10-25 MED ORDER — HYDROMORPHONE HCL 1 MG/ML IJ SOLN: 1.0000 mg | INTRAMUSCULAR | Status: DC | PRN

## 2019-10-25 MED ORDER — SERTRALINE HCL 50 MG PO TABS
50.0000 mg | ORAL_TABLET | Freq: Every day | ORAL | Status: DC
  Administered 2019-10-26 – 2019-10-27 (×2): 50 mg via ORAL
  Filled 2019-10-25 (×2): qty 1

## 2019-10-25 MED ORDER — FENTANYL CITRATE (PF) 100 MCG/2ML IJ SOLN
INTRAMUSCULAR | Status: DC | PRN
  Administered 2019-10-25: 15 ug via INTRATHECAL

## 2019-10-25 MED ORDER — OXYTOCIN-SODIUM CHLORIDE 30-0.9 UT/500ML-% IV SOLN: INTRAVENOUS | Status: DC | PRN

## 2019-10-25 MED ORDER — OXYTOCIN-SODIUM CHLORIDE 30-0.9 UT/500ML-% IV SOLN: 2.5000 [IU]/h | INTRAVENOUS | Status: AC

## 2019-10-25 MED ORDER — OXYTOCIN 10 UNIT/ML IJ SOLN: INTRAMUSCULAR | Status: DC | PRN

## 2019-10-25 MED ORDER — PHENYLEPHRINE HCL-NACL 20-0.9 MG/250ML-% IV SOLN
INTRAVENOUS | Status: AC
  Filled 2019-10-25: qty 250

## 2019-10-25 MED ORDER — CEFAZOLIN SODIUM-DEXTROSE 2-4 GM/100ML-% IV SOLN
2.0000 g | INTRAVENOUS | Status: AC
  Administered 2019-10-25: 2 g via INTRAVENOUS

## 2019-10-25 MED ORDER — DEXAMETHASONE SODIUM PHOSPHATE 4 MG/ML IJ SOLN
INTRAMUSCULAR | Status: DC | PRN
  Administered 2019-10-25: 10 mg via INTRAVENOUS

## 2019-10-25 MED ORDER — SIMETHICONE 80 MG PO CHEW
80.0000 mg | CHEWABLE_TABLET | ORAL | Status: DC
  Administered 2019-10-26 – 2019-10-27 (×2): 80 mg via ORAL
  Filled 2019-10-25 (×2): qty 1

## 2019-10-25 MED ORDER — CELECOXIB 200 MG PO CAPS
ORAL_CAPSULE | ORAL | Status: AC
  Filled 2019-10-25: qty 2

## 2019-10-25 MED ORDER — MORPHINE SULFATE (PF) 0.5 MG/ML IJ SOLN
INTRAMUSCULAR | Status: AC
  Filled 2019-10-25: qty 10

## 2019-10-25 MED ORDER — OXYTOCIN-SODIUM CHLORIDE 30-0.9 UT/500ML-% IV SOLN
INTRAVENOUS | Status: DC | PRN
  Administered 2019-10-25: 200 mL via INTRAVENOUS

## 2019-10-25 MED ORDER — MENTHOL 3 MG MT LOZG: 1.0000 | LOZENGE | OROMUCOSAL | Status: DC | PRN

## 2019-10-25 MED ORDER — IBUPROFEN 800 MG PO TABS
800.0000 mg | ORAL_TABLET | Freq: Four times a day (QID) | ORAL | Status: DC
  Administered 2019-10-27 (×3): 800 mg via ORAL
  Filled 2019-10-25 (×3): qty 1

## 2019-10-25 MED ORDER — LACTATED RINGERS IV SOLN: INTRAVENOUS | Status: DC

## 2019-10-25 MED ORDER — PHENYLEPHRINE HCL-NACL 20-0.9 MG/250ML-% IV SOLN
INTRAVENOUS | Status: DC | PRN
  Administered 2019-10-25: 60 ug/min via INTRAVENOUS

## 2019-10-25 MED ORDER — CEFAZOLIN SODIUM-DEXTROSE 2-4 GM/100ML-% IV SOLN
INTRAVENOUS | Status: AC
  Filled 2019-10-25: qty 100

## 2019-10-25 MED ORDER — ENOXAPARIN SODIUM 40 MG/0.4ML ~~LOC~~ SOLN
40.0000 mg | SUBCUTANEOUS | Status: DC
  Administered 2019-10-26 – 2019-10-27 (×2): 40 mg via SUBCUTANEOUS
  Filled 2019-10-25 (×2): qty 0.4

## 2019-10-25 MED ORDER — EPHEDRINE 5 MG/ML INJ
INTRAVENOUS | Status: AC
  Filled 2019-10-25: qty 10

## 2019-10-25 MED ORDER — SOD CITRATE-CITRIC ACID 500-334 MG/5ML PO SOLN
ORAL | Status: AC
  Filled 2019-10-25: qty 30

## 2019-10-25 MED ORDER — MORPHINE SULFATE (PF) 0.5 MG/ML IJ SOLN
INTRAMUSCULAR | Status: DC | PRN
  Administered 2019-10-25: 150 ug via INTRATHECAL

## 2019-10-25 MED ORDER — ZOLPIDEM TARTRATE 5 MG PO TABS: 5.0000 mg | ORAL_TABLET | Freq: Every evening | ORAL | Status: DC | PRN

## 2019-10-25 MED ORDER — OXYCODONE-ACETAMINOPHEN 5-325 MG PO TABS: 2.0000 | ORAL_TABLET | ORAL | Status: DC | PRN

## 2019-10-25 MED ORDER — OXYTOCIN-SODIUM CHLORIDE 30-0.9 UT/500ML-% IV SOLN
INTRAVENOUS | Status: AC
  Filled 2019-10-25: qty 500

## 2019-10-25 MED ORDER — OXYCODONE HCL 5 MG/5ML PO SOLN: 5.0000 mg | Freq: Once | ORAL | Status: DC | PRN

## 2019-10-25 MED ORDER — KETOROLAC TROMETHAMINE 30 MG/ML IJ SOLN
30.0000 mg | Freq: Four times a day (QID) | INTRAMUSCULAR | Status: AC
  Administered 2019-10-26 (×3): 30 mg via INTRAVENOUS
  Filled 2019-10-25 (×3): qty 1

## 2019-10-25 MED ORDER — FENTANYL CITRATE (PF) 100 MCG/2ML IJ SOLN: 25.0000 ug | INTRAMUSCULAR | Status: DC | PRN

## 2019-10-25 MED ORDER — GABAPENTIN 300 MG PO CAPS
300.0000 mg | ORAL_CAPSULE | ORAL | Status: AC
  Administered 2019-10-25: 300 mg via ORAL

## 2019-10-25 MED ORDER — TETANUS-DIPHTH-ACELL PERTUSSIS 5-2.5-18.5 LF-MCG/0.5 IM SUSP: 0.5000 mL | Freq: Once | INTRAMUSCULAR | Status: DC

## 2019-10-25 MED ORDER — ONDANSETRON HCL 4 MG/2ML IJ SOLN
INTRAMUSCULAR | Status: DC | PRN
  Administered 2019-10-25: 4 mg via INTRAVENOUS

## 2019-10-25 MED ORDER — GABAPENTIN 100 MG PO CAPS
300.0000 mg | ORAL_CAPSULE | Freq: Two times a day (BID) | ORAL | Status: DC
  Administered 2019-10-26 – 2019-10-27 (×3): 300 mg via ORAL
  Filled 2019-10-25 (×4): qty 3

## 2019-10-25 MED ORDER — ACETAMINOPHEN 325 MG PO TABS: 325.0000 mg | ORAL_TABLET | ORAL | Status: DC | PRN

## 2019-10-25 MED ORDER — OXYCODONE HCL 5 MG PO TABS: 5.0000 mg | ORAL_TABLET | Freq: Once | ORAL | Status: DC | PRN

## 2019-10-25 MED ORDER — DIBUCAINE (PERIANAL) 1 % EX OINT: 1.0000 "application " | TOPICAL_OINTMENT | CUTANEOUS | Status: DC | PRN

## 2019-10-25 MED ORDER — WITCH HAZEL-GLYCERIN EX PADS: 1.0000 "application " | MEDICATED_PAD | CUTANEOUS | Status: DC | PRN

## 2019-10-25 MED ORDER — MEASLES, MUMPS & RUBELLA VAC IJ SOLR: 0.5000 mL | Freq: Once | INTRAMUSCULAR | Status: DC

## 2019-10-25 MED ORDER — FERROUS SULFATE 325 (65 FE) MG PO TABS: 325.0000 mg | ORAL_TABLET | Freq: Two times a day (BID) | ORAL | Status: DC

## 2019-10-25 MED ORDER — KETOROLAC TROMETHAMINE 30 MG/ML IJ SOLN
INTRAMUSCULAR | Status: AC
  Filled 2019-10-25: qty 1

## 2019-10-25 MED ORDER — MAGNESIUM HYDROXIDE 400 MG/5ML PO SUSP: 30.0000 mL | ORAL | Status: DC | PRN

## 2019-10-25 MED ORDER — DIPHENHYDRAMINE HCL 25 MG PO CAPS: 25.0000 mg | ORAL_CAPSULE | Freq: Four times a day (QID) | ORAL | Status: DC | PRN

## 2019-10-25 MED ORDER — CELECOXIB 200 MG PO CAPS
400.0000 mg | ORAL_CAPSULE | ORAL | Status: AC
  Administered 2019-10-25: 400 mg via ORAL

## 2019-10-25 MED ORDER — SOD CITRATE-CITRIC ACID 500-334 MG/5ML PO SOLN
30.0000 mL | ORAL | Status: AC
  Administered 2019-10-25: 30 mL via ORAL

## 2019-10-25 MED ORDER — SODIUM CHLORIDE 0.9 % IV SOLN: INTRAVENOUS | Status: DC | PRN

## 2019-10-25 MED ORDER — FENTANYL CITRATE (PF) 100 MCG/2ML IJ SOLN
INTRAMUSCULAR | Status: AC
  Filled 2019-10-25: qty 2

## 2019-10-25 MED ORDER — BUPIVACAINE IN DEXTROSE 0.75-8.25 % IT SOLN
INTRATHECAL | Status: DC | PRN
  Administered 2019-10-25: 1.6 mL via INTRATHECAL

## 2019-10-25 MED ORDER — ACETAMINOPHEN 160 MG/5ML PO SOLN: 325.0000 mg | ORAL | Status: DC | PRN

## 2019-10-25 MED ORDER — SIMETHICONE 80 MG PO CHEW: 80.0000 mg | CHEWABLE_TABLET | ORAL | Status: DC | PRN

## 2019-10-25 MED ORDER — PRENATAL MULTIVITAMIN CH
1.0000 | ORAL_TABLET | Freq: Every day | ORAL | Status: DC
  Administered 2019-10-26 – 2019-10-27 (×2): 1 via ORAL
  Filled 2019-10-25 (×2): qty 1

## 2019-10-25 MED ORDER — KETOROLAC TROMETHAMINE 30 MG/ML IJ SOLN
30.0000 mg | Freq: Once | INTRAMUSCULAR | Status: AC
  Administered 2019-10-25: 30 mg via INTRAVENOUS

## 2019-10-25 MED ORDER — COCONUT OIL OIL: 1.0000 "application " | TOPICAL_OIL | Status: DC | PRN

## 2019-10-25 MED ORDER — OXYCODONE HCL 5 MG PO TABS
5.0000 mg | ORAL_TABLET | ORAL | Status: DC | PRN
  Administered 2019-10-27: 5 mg via ORAL
  Filled 2019-10-25: qty 1

## 2019-10-25 MED ORDER — ONDANSETRON HCL 4 MG/2ML IJ SOLN: 4.0000 mg | Freq: Once | INTRAMUSCULAR | Status: DC | PRN

## 2019-10-25 MED ORDER — ONDANSETRON HCL 4 MG/2ML IJ SOLN
INTRAMUSCULAR | Status: AC
  Filled 2019-10-25: qty 2

## 2019-10-25 MED ORDER — GABAPENTIN 300 MG PO CAPS
ORAL_CAPSULE | ORAL | Status: AC
  Filled 2019-10-25: qty 1

## 2019-10-25 MED ORDER — DEXAMETHASONE SODIUM PHOSPHATE 10 MG/ML IJ SOLN
INTRAMUSCULAR | Status: AC
  Filled 2019-10-25: qty 1

## 2019-10-25 SURGICAL SUPPLY — 33 items
CHLORAPREP W/TINT 26ML (MISCELLANEOUS) ×3 IMPLANT
CLAMP CORD UMBIL (MISCELLANEOUS) IMPLANT
CLIP FILSHIE TUBAL LIGA STRL (Clip) ×2 IMPLANT
CLOTH BEACON ORANGE TIMEOUT ST (SAFETY) ×3 IMPLANT
DRAIN JACKSON PRT FLT 7MM (DRAIN) IMPLANT
DRSG OPSITE POSTOP 4X10 (GAUZE/BANDAGES/DRESSINGS) ×3 IMPLANT
ELECT REM PT RETURN 9FT ADLT (ELECTROSURGICAL) ×3
ELECTRODE REM PT RTRN 9FT ADLT (ELECTROSURGICAL) ×1 IMPLANT
EVACUATOR SILICONE 100CC (DRAIN) IMPLANT
EXTRACTOR VACUUM M CUP 4 TUBE (SUCTIONS) IMPLANT
EXTRACTOR VACUUM M CUP 4' TUBE (SUCTIONS)
GLOVE BIO SURGEON STRL SZ7 (GLOVE) ×3 IMPLANT
GLOVE BIOGEL PI IND STRL 7.0 (GLOVE) ×2 IMPLANT
GLOVE BIOGEL PI INDICATOR 7.0 (GLOVE) ×4
GOWN STRL REUS W/TWL LRG LVL3 (GOWN DISPOSABLE) ×6 IMPLANT
KIT ABG SYR 3ML LUER SLIP (SYRINGE) IMPLANT
NDL HYPO 25X5/8 SAFETYGLIDE (NEEDLE) ×1 IMPLANT
NEEDLE HYPO 25X5/8 SAFETYGLIDE (NEEDLE) ×3 IMPLANT
NS IRRIG 1000ML POUR BTL (IV SOLUTION) ×3 IMPLANT
PACK C SECTION WH (CUSTOM PROCEDURE TRAY) ×3 IMPLANT
PAD ABD 8X10 STRL (GAUZE/BANDAGES/DRESSINGS) ×2 IMPLANT
PAD OB MATERNITY 4.3X12.25 (PERSONAL CARE ITEMS) ×3 IMPLANT
PENCIL SMOKE EVAC W/HOLSTER (ELECTROSURGICAL) ×3 IMPLANT
RTRCTR C-SECT PINK 25CM LRG (MISCELLANEOUS) ×3 IMPLANT
SPONGE GAUZE 4X4 12PLY STER LF (GAUZE/BANDAGES/DRESSINGS) ×4 IMPLANT
SUT PROLENE 1 CT (SUTURE) ×2 IMPLANT
SUT VIC AB 0 CTX 36 (SUTURE) ×15
SUT VIC AB 0 CTX36XBRD ANBCTRL (SUTURE) ×5 IMPLANT
SUT VIC AB 4-0 KS 27 (SUTURE) ×3 IMPLANT
TAPE MEDIFIX FOAM 3 (GAUZE/BANDAGES/DRESSINGS) ×2 IMPLANT
TOWEL OR 17X24 6PK STRL BLUE (TOWEL DISPOSABLE) ×3 IMPLANT
TRAY FOLEY W/BAG SLVR 14FR LF (SET/KITS/TRAYS/PACK) ×3 IMPLANT
WATER STERILE IRR 1000ML POUR (IV SOLUTION) ×3 IMPLANT

## 2019-10-25 NOTE — Op Note (Signed)
[facility] PROCEDURE DATE: 10/25/2019  PREOPERATIVE DIAGNOSES: Intrauterine pregnancy at [redacted]w[redacted]d weeks gestation; previous cesarean sections x 2; undesired fertility  POSTOPERATIVE DIAGNOSES: The same  PROCEDURE: Repeat Low Transverse Cesarean Section, Bilateral Tubal Sterilization using Filshie clips  SURGEON:  Dr. Verita Schneiders  ASSISTANT:  Dr. Barrington Ellison  ANESTHESIOLOGIST: Dr. Courtney Paris Oddono  INDICATIONS: Teresa Reed is a 29 y.o. [phone] at [redacted]w[redacted]d here for cesarean section and bilateral tubal sterilization secondary to the indications listed under preoperative diagnoses; please see preoperative note for further details.  The risks of surgery were discussed with the patient including but were not limited to: bleeding which may require transfusion or reoperation; infection which may require antibiotics; injury to bowel, bladder, ureters or other surrounding organs; injury to the fetus; need for additional procedures including hysterectomy in the event of a life-threatening hemorrhage; formation of adhesions; placental abnormalities wth subsequent pregnancies; incisional problems; thromboembolic phenomenon and other postoperative/anesthesia complications.  Patient also desires permanent sterilization.  Other reversible forms of contraception were discussed with patient; she declines all other modalities.   Risks of sterilization procedure discussed with patient including but not limited to: risk of regret, permanence of method, bleeding, infection, injury to surrounding organs and need for additional procedures.  Failure risk of about 1% with increased risk of ectopic gestation if pregnancy occurs was also discussed with patient.  Also discussed possibility of post-tubal pain syndrome. The patient concurred with the proposed plan, giving informed written consent for the procedures.  FINDINGS:  Viable female infant in cephalic presentation.  Apgars 8 and 8.  Clear amniotic fluid.  Intact  placenta, three vessel cord.  Normal uterus, fallopian tubes and ovaries bilaterally. Fallopian tubes were sterilized bilaterally. Minimal intraperitoneal adhesive disease.   ANESTHESIA: Epidural ESTIMATED BLOOD LOSS: 425 ml SPECIMENS: Placenta sent to L&D COMPLICATIONS: None immediate  PROCEDURE IN DETAIL:  The patient preoperatively received intravenous antibiotics and had sequential compression devices applied to her lower extremities.   She was then taken to the operating room where spinal anesthesia was administered and was found to be adequate. She was then placed in a dorsal supine position with a leftward tilt, and prepped and draped in a sterile manner.  A foley catheter was placed into her bladder and attached to constant gravity.  After an adequate timeout was performed, a Pfannenstiel skin incision was made with scalpel over her preexisting scar and carried through to the underlying layer of fascia. The fascia was incised in the midline, and this incision was extended bilaterally using the Mayo scissors.  Kocher clamps were applied to the superior aspect of the fascial incision and the underlying rectus muscles were dissected off bluntly.  A similar process was carried out on the inferior aspect of the fascial incision. The rectus muscles were separated in the midline and the peritoneum was entered bluntly. The Alexis self-retaining retractor was introduced into the abdominal cavity.  Attention was turned to the lower uterine segment where a low transverse hysterotomy was made with a scalpel and extended bilaterally bluntly.  The infant was successfully delivered, the cord was clamped and cut after one minute, and the infant was handed over to the awaiting neonatology team. Uterine massage was then administered, and the placenta delivered intact with a three-vessel cord. The uterus was then cleared of clots and debris.  The hysterotomy was closed with 0 Vicryl in a running locked fashion, and an  imbricating layer was also placed with 0 Vicryl.   Attention was then  turned to the fallopian tubes, and Filshie clips were placed about 3 cm from the cornua of both fallopian tubes, with care given to incorporate the underlying mesosalpinx on both sides, allowing for bilateral tubal sterilization.  Excellent hemostasis was noted. The pelvis was cleared of all clot and debris. Hemostasis was confirmed on all surfaces.  The retractor was removed.  The fascia was then closed using 0 Vicryl in a running fashion.  The subcutaneous layer was irrigated, and the skin was closed with a 4-0 Vicryl subcuticular stitch. The patient tolerated the procedure well. Sponge, instrument and needle counts were correct x 3.  She was taken to the recovery room in stable condition.    Teresa Collins, MD, FACOG Obstetrician & Gynecologist, [facility] for [employer], [facility]

## 2019-10-25 NOTE — H&P (Addendum)
Teresa Reed is a 29 y.o. female presenting for Rpt c/s for history of a classical c/s.  Pt desires a BTL.  Pt was late today due to child care issues.  Pt recently moved our form her partner.  She had depression affecting pregnancy in May and is doing better now.    OB History    Gravida  4   Para  2   Term  1   Preterm  1   AB  1   Living  2     SAB  1   TAB      Ectopic      Multiple      Live Births  2        Obstetric Comments  LOW VERTICAL c-section WITH EXTENSION INTO ACTIVE SEGMENT at [redacted]w[redacted]d for non-reassuring fetal status in the setting of marked hydrocephalus necessitating cesarean delivery on 10/30/2018; basically classical cesarean section       Past Medical History:  Diagnosis Date  . History of ectopic pregnancy 04/17/2018   G2, 2009  . Medical history non-contributory    Past Surgical History:  Procedure Laterality Date  . CESAREAN SECTION    . TUMOR REMOVAL     Family History: family history includes Diabetes in an other family member; Hypertension in an other family member; Lupus in her sister and another family member. Social History:  reports that she has quit smoking. She has never used smokeless tobacco. She reports that she does not drink alcohol and does not use drugs.     Maternal Diabetes: No Genetic Screening: Normal Maternal Ultrasounds/Referrals: Normal Fetal Ultrasounds or other Referrals:  None Maternal Substance Abuse:  No Significant Maternal Medications:  None Significant Maternal Lab Results:  None Other Comments:  None  Review of Systems History   Blood pressure 112/67, temperature 98.3 F (36.8 C), temperature source Oral, resp. rate 16, height 5\' 3"  (1.6 m), weight 88.5 kg, last menstrual period 02/18/2019, unknown if currently breastfeeding. Exam Physical Exam  Prenatal labs: ABO, Rh: --/--/O POS, O POS Performed at [facility], [address] [place], [place]  [phone]/24 1828) Antibody: NEG  (06/24 1828) Rubella: 1.82 (02/22 1009) RPR: NON REACTIVE (06/24 1828)  HBsAg: Negative (02/22 1009)  HIV: NON-REACTIVE (05/10 0942)  GBS:   not done (pt's last prenatal visit May 10)  Assessment/Plan: 29 yo 38 at 37 weeks for Rpt c/s and BTL.  Pt has history of classical c/s.  1.  History of depression--Edinburgh post partum and SW consult.  And referral to [id number] post partum 2. BTL with Filshie clips 3.  Pt felt a lot of pain with last c/s.  Spoke with the anesthesiologist who is aware and will have low threshold for intubation.      The risks of cesarean section discussed with the patient included but were not limited to: bleeding which may require transfusion or reoperation; infection which may require antibiotics; injury to bowel, bladder, ureters or other surrounding organs; injury to the fetus; need for additional procedures including hysterectomy in the event of a life-threatening hemorrhage; placental abnormalities wth subsequent pregnancies, incisional problems, thromboembolic phenomenon and other postoperative/anesthesia complications. The patient concurred with the proposed plan, giving informed written consent for the procedure.   Patient desires permanent sterilization.  Other reversible forms of contraception were discussed with patient; she declines all other modalities. Risks of procedure discussed with patient including but not limited to: risk of regret, permanence of method, bleeding,  infection, injury to surrounding organs and need for additional procedures.  Failure risk of 0.5-1% with increased risk of ectopic gestation if pregnancy occurs was also discussed with patient.  Pt aware Filshie clips are being used for the sterilization.  Patient verbalized understanding of these risks and wants to proceed with sterilization.  Written informed consent obtained.  To OR when ready.   Silas Sacramento 10/25/2019, 4:35 PM

## 2019-10-25 NOTE — Progress Notes (Signed)
Report from Night Shift RN stated patient would be late arriving, but would be here by 0900. Patient called at [phone] for update and told this RN she would hopefully be here by 1100. Dr. Penne Lash notified.  Kaylany Tesoriero [place], [place] 10/25/2019 9:37 AM

## 2019-10-25 NOTE — Discharge Summary (Signed)
Postpartum Discharge Summary     Patient Name: Teresa Reed DOB: 08/09/1990 MRN: [id number]  Date of admission: 10/25/2019 Delivery date:10/25/2019  Delivering provider: Chauncey Mann  Date of discharge: 10/27/2019  Admitting diagnosis: Postpartum care following cesarean delivery [Z39.2] Delivery by elective cesarean section [O82] Intrauterine pregnancy: [redacted]w[redacted]d    Secondary diagnosis:  Principal Problem:   Postpartum care following cesarean delivery Active Problems:   History of seizure   GAD (generalized anxiety disorder)   History of classical cesarean section   History of fetal hydrocephalus in previous pregnancy, currently pregnant   Short interval between pregnancies affecting pregnancy, antepartum   Fibromyalgia   Delivery by elective cesarean section  Additional problems: None    Discharge diagnosis: Term Pregnancy Delivered                                              Post partum procedures:None Augmentation: N/A Complications: None  Hospital course: Sceduled C/S   29y.o. yo [id number] admitted to the hospital 10/25/2019 for scheduled cesarean section with the following indication:hx of classical c-section.Delivery details are as follows:  Membrane Rupture Time/Date: 6:39 PM ,10/25/2019   Delivery Method:C-Section, Low Transverse  Details of operation can be found in separate operative note. Filshie clips placed intraoperatively. Zoloft continued PP. SW consulted without barriers to discharge. Patient had an uncomplicated postpartum course.  She is ambulating, tolerating a regular diet, passing flatus, and urinating well. Patient is discharged home in stable condition on  10/27/19        Newborn Data: Birth date:10/25/2019  Birth time:6:40 PM  Gender:Female  Living status:Living  Apgars:8 ,8  Weight:2860 g     Magnesium Sulfate received: No BMZ received: No Rhophylac:N/A MMR:N/A T-DaP:Given prenatally Flu: No Transfusion:No  Physical exam   Vitals:   10/26/19 0839 10/26/19 1412 10/26/19 2206 10/27/19 0626  BP: 102/60 105/62 (!) 106/54 106/73  Pulse: 72 70 79 68  Resp: 18 19 18 18   Temp: 98 F (36.7 C) 98.1 F (36.7 C) 98.5 F (36.9 C) 98.2 F (36.8 C)  TempSrc: Oral  Oral Axillary  SpO2: 99% 100% 99% 99%  Weight:      Height:       General: alert, cooperative and no distress Lochia: appropriate Uterine Fundus: firm Incision: Dressing is clean, dry, and intact DVT Evaluation: No evidence of DVT seen on physical exam. Labs: Lab Results  Component Value Date   WBC 11.3 (H) 10/26/2019   HGB 9.3 (L) 10/26/2019   HCT 29.2 (L) 10/26/2019   MCV 83.2 10/26/2019   PLT 297 10/26/2019   CMP Latest Ref Rng & Units 10/26/2019  Glucose 65 - 99 mg/dL -  BUN 6 - 20 mg/dL -  Creatinine 0.44 - 1.00 mg/dL 0.58  Sodium 134 - 144 mmol/L -  Potassium 3.5 - 5.2 mmol/L -  Chloride 96 - 106 mmol/L -  CO2 20 - 29 mmol/L -  Calcium 8.7 - 10.2 mg/dL -  Total Protein 6.0 - 8.5 g/dL -  Total Bilirubin 0.0 - 1.2 mg/dL -  Alkaline Phos 39 - 117 IU/L -  AST 0 - 40 IU/L -  ALT 0 - 32 IU/L -   Edinburgh Score: Edinburgh Postnatal Depression Scale Screening Tool 10/25/2019  I have been able to laugh and see the funny side of things. (No  Data)     After visit meds:  Allergies as of 10/27/2019   No Known Allergies     Medication List    TAKE these medications   ferrous sulfate 325 (65 FE) MG tablet Take 1 tablet (325 mg total) by mouth daily with breakfast.   ibuprofen 800 MG tablet Commonly known as: ADVIL Take 1 tablet (800 mg total) by mouth every 6 (six) hours.   multivitamin-prenatal 27-0.8 MG Tabs tablet Take 1 tablet by mouth daily at 12 noon.   oxyCODONE-acetaminophen 5-325 MG tablet Commonly known as: PERCOCET/ROXICET Take 2 tablets by mouth every 4 (four) hours as needed for severe pain ((when tolerating fluids)).   senna-docusate 8.6-50 MG tablet Commonly known as: Senokot-S Take 2 tablets by mouth  daily. Start taking on: October 28, 2019   sertraline 50 MG tablet Commonly known as: Zoloft Take 1 tablet (50 mg total) by mouth daily.        Discharge home in stable condition Infant Feeding: Bottle and Breast Infant Disposition:home with mother Discharge instruction: per After Visit Summary and Postpartum booklet. Activity: Advance as tolerated. Pelvic rest for 6 weeks.  Diet: routine diet Future Appointments:No future appointments. Follow up Visit:   Please schedule this patient for a Virtual postpartum visit in 4 weeks with the following provider: Any provider. Additional Postpartum F/U:Postpartum Depression checkup and Incision check 1 week  Low risk pregnancy complicated by: none Delivery mode:  C-Section, Low Transverse  Anticipated Birth Control:  Filshie clips for BTL   10/27/2019 Chauncey Mann, MD

## 2019-10-25 NOTE — Progress Notes (Signed)
Patient arrived to Pre-op at 1148. Patient aware that there are other cases scheduled that can not be bumped to fit her in. Will add-on to end of the day.  Dr. Penne Lash made aware.   10/25/2019 11:49 AM Teresa Reed [facility], RN

## 2019-10-25 NOTE — Anesthesia Postprocedure Evaluation (Signed)
Anesthesia Post Note  Patient: Teresa Reed  Procedure(s) Performed: CESAREAN SECTION WITH BILATERAL TUBAL LIGATION (Bilateral )     Patient location during evaluation: PACU Anesthesia Type: Spinal Level of consciousness: oriented and awake and alert Pain management: pain level controlled Vital Signs Assessment: post-procedure vital signs reviewed and stable Respiratory status: spontaneous breathing, respiratory function stable and patient connected to nasal cannula oxygen Cardiovascular status: blood pressure returned to baseline and stable Postop Assessment: no headache, no backache and no apparent nausea or vomiting Anesthetic complications: no   No complications documented.  Last Vitals:  Vitals:   10/25/19 2215 10/25/19 2314  BP: (!) 99/56 (!) 101/44  Pulse: 81 100  Resp: 18 18  Temp: 37.3 C 36.6 C  SpO2: 97% 97%    Last Pain:  Vitals:   10/25/19 2315  TempSrc:   PainSc: 3    Pain Goal: Patients Stated Pain Goal: 1 (10/25/19 2315)              Epidural/Spinal Function Cutaneous sensation: Normal sensation (10/25/19 2315), Patient able to flex knees: Yes (10/25/19 2315), Patient able to lift hips off bed: Yes (10/25/19 2315), Back pain beyond tenderness at insertion site: No (10/25/19 2315), Progressively worsening motor and/or sensory loss: No (10/25/19 2315), Bowel and/or bladder incontinence post epidural: No (10/25/19 2315)  Deng Kemler

## 2019-10-25 NOTE — Anesthesia Preprocedure Evaluation (Signed)
Anesthesia Evaluation  Patient identified by MRN, date of birth, ID band Patient awake    Reviewed: Allergy & Precautions, H&P , NPO status , Patient's Chart, lab work & pertinent test results, reviewed documented beta blocker date and time   Airway Mallampati: II  TM Distance: >3 FB Neck ROM: full    Dental no notable dental hx.    Pulmonary neg pulmonary ROS, former smoker,    Pulmonary exam normal breath sounds clear to auscultation       Cardiovascular negative cardio ROS Normal cardiovascular exam Rhythm:regular Rate:Normal     Neuro/Psych negative neurological ROS  negative psych ROS   GI/Hepatic negative GI ROS, Neg liver ROS,   Endo/Other  negative endocrine ROS  Renal/GU negative Renal ROS  negative genitourinary   Musculoskeletal   Abdominal   Peds  Hematology negative hematology ROS (+)   Anesthesia Other Findings   Reproductive/Obstetrics (+) Pregnancy                             Anesthesia Physical Anesthesia Plan  ASA: II  Anesthesia Plan: Spinal   Post-op Pain Management:    Induction:   PONV Risk Score and Plan:   Airway Management Planned:   Additional Equipment:   Intra-op Plan:   Post-operative Plan:   Informed Consent: I have reviewed the patients History and Physical, chart, labs and discussed the procedure including the risks, benefits and alternatives for the proposed anesthesia with the patient or authorized representative who has indicated his/her understanding and acceptance.     Dental Advisory Given  Plan Discussed with: Anesthesiologist  Anesthesia Plan Comments: (  )        Anesthesia Quick Evaluation

## 2019-10-25 NOTE — Transfer of Care (Signed)
Immediate Anesthesia Transfer of Care Note  Patient: Teresa Reed  Procedure(s) Performed: CESAREAN SECTION WITH BILATERAL TUBAL LIGATION (Bilateral )  Patient Location: PACU  Anesthesia Type:Spinal  Level of Consciousness: awake, alert , oriented and patient cooperative  Airway & Oxygen Therapy: Patient Spontanous Breathing  Post-op Assessment: Report given to RN and Post -op Vital signs reviewed and stable  Post vital signs: Reviewed and stable  Last Vitals:  Vitals Value Taken Time  BP    Temp    Pulse    Resp    SpO2      Last Pain:  Vitals:   10/25/19 1209  TempSrc: Oral         Complications: No complications documented.

## 2019-10-25 NOTE — Anesthesia Procedure Notes (Addendum)
Spinal  Patient location during procedure: OR Start time: 10/25/2019 6:17 PM End time: 10/25/2019 6:20 PM Staffing Anesthesiologist: Bethena Midget, MD Preanesthetic Checklist Completed: patient identified, IV checked, site marked, risks and benefits discussed, surgical consent, monitors and equipment checked, pre-op evaluation and timeout performed Spinal Block Patient position: sitting Prep: DuraPrep Patient monitoring: heart rate, cardiac monitor, continuous pulse ox and blood pressure Approach: midline Location: L3-4 Injection technique: single-shot Needle Needle type: Sprotte  Needle gauge: 24 G Needle length: 9 cm Assessment Sensory level: T4

## 2019-10-26 ENCOUNTER — Encounter (HOSPITAL_COMMUNITY): Payer: Self-pay | Admitting: Obstetrics & Gynecology

## 2019-10-26 LAB — CBC
HCT: 29.2 % — ABNORMAL LOW (ref 36.0–46.0)
Hemoglobin: 9.3 g/dL — ABNORMAL LOW (ref 12.0–15.0)
MCH: 26.5 pg (ref 26.0–34.0)
MCHC: 31.8 g/dL (ref 30.0–36.0)
MCV: 83.2 fL (ref 80.0–100.0)
Platelets: 297 10*3/uL (ref 150–400)
RBC: 3.51 MIL/uL — ABNORMAL LOW (ref 3.87–5.11)
RDW: 14.3 % (ref 11.5–15.5)
WBC: 11.3 10*3/uL — ABNORMAL HIGH (ref 4.0–10.5)
nRBC: 0 % (ref 0.0–0.2)

## 2019-10-26 LAB — CREATININE, SERUM
Creatinine, Ser: 0.58 mg/dL (ref 0.44–1.00)
GFR calc Af Amer: >60 mL/min (ref >60)
GFR calc non Af Amer: >60 mL/min (ref >60)

## 2019-10-26 MED ORDER — FERROUS SULFATE 325 (65 FE) MG PO TABS
325.0000 mg | ORAL_TABLET | Freq: Every day | ORAL | Status: DC
  Administered 2019-10-26 – 2019-10-27 (×2): 325 mg via ORAL
  Filled 2019-10-26 (×2): qty 1

## 2019-10-26 MED ORDER — ACETAMINOPHEN 325 MG PO TABS
650.0000 mg | ORAL_TABLET | ORAL | Status: DC | PRN
  Administered 2019-10-26 – 2019-10-27 (×2): 650 mg via ORAL
  Filled 2019-10-26 (×2): qty 2

## 2019-10-26 NOTE — Lactation Note (Signed)
This note was copied from a baby's chart. Lactation Consultation Note  Patient Name: Teresa Reed [id number] Date: 10/26/2019 Reason for consult: Follow-up assessment;Early term 46-38.6wks  P3 mother whose infant is now 80 hours old.  This is an ETI at 37+0 weeks weighing > 6 lbs.  Mother breast fed her second child (now 31 year old) for 3 months.  Mother has been breast feeding only with no supplementation at this time.  Reviewed the LPTI policy with mother and explained the benefits of beginning supplementation.  Mother in agreement with supplementation.  Presented her options of donor breast milk or formula and mother desires donor breast milk.  Consent form signed.    Suggested mother begin pumping with the DEBP now and continue pumping every three hours after feeding baby.  Mother very willing to pump.  Provided education on the pump parts, assembly, disassembly and cleaning.  Observed mother pumping while educating her on breast feeding and pumping basic information.  #24 flange changed to a #27 flange size for better fit and comfort.  Mother was unable to obtain any colostrum at this time.  RN will follow up on obtaining donor breast milk and mother aware of the process in obtaining this milk with every feeding.  She will bottle feed with the purple extra slow flow nipple.    Mother is hoping to be discharged tomorrow.  I explained that she will need to be very diligent about working on breast feeding and supplementing.  Mother will spend 5-10 minutes trying to latch and, if unsuccessful, will progress to the supplement.  She will always breast feed prior to supplementation and post pump for 15 minutes after every feeding.  She will call her RN/LC for assistance as needed.  RN updated.   Maternal Data Formula Feeding for Exclusion: Yes Reason for exclusion: Mother's choice to formula and breast feed on admission Has patient been taught Hand Expression?: Yes Does the patient have  breastfeeding experience prior to this delivery?: Yes  Feeding Feeding Type: Breast Fed  LATCH Score                   Interventions    Lactation Tools Discussed/Used WIC Program: Yes Pump Review: Setup, frequency, and cleaning;Milk Storage Initiated by:: Jajaira Ruis Date initiated:: 10/26/19   Consult Status Consult Status: Follow-up Date: 10/27/19 Follow-up type: In-patient    Dora Sims 10/26/2019, 5:19 PM

## 2019-10-26 NOTE — Lactation Note (Addendum)
This note was copied from a baby's chart. Lactation Consultation Note  Patient Name: Teresa Reed [id number] Date: 10/26/2019 Reason for consult: Initial assessment;Early term 37-38.6wks P2, 8 hour ETI female infant. Mom with hx: 2nd C/S and close spaced pregnancies has one year old son.  Infant had two void diapers since delivery. Per mom, she BF her one year old son for 3 months. Per mom, she would like hand pump due not having breast pump at home, per mom , she works from home but would like have pump for occasional use. Mom had large breast that are semi flat and she will pre-pump breast prior to latching infant. Mom has nipple piercing's in both nipples.  Mom receives [facility] mom was given hand pump  due having semi flat nipples. Mom will pre-pump  with hand pump ( using size 30 mm breast flange) prior to latching infant at breast.  Per mom, infant been very sleepy and not latching at breast she made two attempts. Mom attempted to latch infant on her left breast using the football hold, infant latched but only held breast in mouth, did not elicit the suck and swallow response at this time to sleepy. Mom will continue to make attempts and breastfeed infant according to hunger cues, on demand , 8 to 12+ times within 24 hours and not exceed 3 hours without BF infant. Mom knows to hand express and give infant back volume if still not latching at breast. Mom taught back hand expression and infant was given 8 mls of colostrum by spoon.  Mom knows to call RN or LC if she needs further assistance with latching infant at breast. Mom will continue to do STS with infant. Reviewed Baby & Me book's Breastfeeding Basics.  Mom made aware of O/P services, breastfeeding support groups, community resources, and our phone # for post-discharge questions.   Maternal Data Formula Feeding for Exclusion: Yes Reason for exclusion: Mother's choice to formula and breast feed on admission Has  patient been taught Hand Expression?: Yes Does the patient have breastfeeding experience prior to this delivery?: Yes  Feeding Feeding Type: Breast Fed  LATCH Score Latch: Too sleepy or reluctant, no latch achieved, no sucking elicited.  Audible Swallowing: None  Type of Nipple: Flat  Comfort (Breast/Nipple): Soft / non-tender  Hold (Positioning): Assistance needed to correctly position infant at breast and maintain latch.  LATCH Score: 4  Interventions Interventions: Breast feeding basics reviewed;Assisted with latch;Breast compression;Skin to skin;Adjust position;Breast massage;Support pillows;Hand express;Position options;Pre-pump if needed;Expressed milk;Hand pump  Lactation Tools Discussed/Used WIC Program: Yes Pump Review: Setup, frequency, and cleaning;Milk Storage Initiated by:: Danelle Earthly, IBCLC Date initiated:: 10/26/19   Consult Status Consult Status: Follow-up Date: 10/26/19 Follow-up type: In-patient    Danelle Earthly 10/26/2019, 3:02 AM
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
## 2019-10-26 NOTE — Progress Notes (Signed)
Subjective: Postpartum Day 1: Cesarean Delivery Patient reports feeling well. Pain is well-controlled and lochia appropriate. She has not ambulated yet and foley still in place. Has not yet passed gas. Breast and bottle feeding.   Objective: Vital signs in last 24 hours: Temp:  [97.8 F (36.6 C)-99.2 F (37.3 C)] 98.7 F (37.1 C) (06/26 0445) Pulse Rate:  [67-100] 83 (06/26 0020) Resp:  [16-26] 18 (06/26 0020) BP: (91-118)/(44-80) 97/51 (06/26 0020) SpO2:  [95 %-100 %] 98 % (06/26 0445) Weight:  [88.5 kg] 88.5 kg (06/25 1209)  Physical Exam:  General: alert, cooperative and no distress Lochia: appropriate Uterine Fundus: firm Incision: no significant drainage, no significant erythema DVT Evaluation: No evidence of DVT seen on physical exam.  Recent Labs    10/24/19 1828 10/26/19 0443  HGB 10.4* 9.3*  HCT 32.8* 29.2*    Assessment/Plan: Status post Cesarean section. Doing well postoperatively.  Continue current care. Likely discharge tomorrow. PO iron initiated. Breast and bottle feeding Vitals stable Cont Zoloft SW consult pending S/p BTL with Filshie clips  Joselyn Arrow 10/26/2019, 7:20 AM

## 2019-10-27 ENCOUNTER — Ambulatory Visit: Payer: Self-pay

## 2019-10-27 LAB — BIRTH TISSUE RECOVERY COLLECTION (PLACENTA DONATION)

## 2019-10-27 MED ORDER — FERROUS SULFATE 325 (65 FE) MG PO TABS: 325.0000 mg | ORAL_TABLET | Freq: Every day | ORAL | 0 refills | Status: DC

## 2019-10-27 MED ORDER — IBUPROFEN 800 MG PO TABS: 800.0000 mg | ORAL_TABLET | Freq: Four times a day (QID) | ORAL | 0 refills | Status: DC

## 2019-10-27 MED ORDER — SENNOSIDES-DOCUSATE SODIUM 8.6-50 MG PO TABS: 2.0000 | ORAL_TABLET | ORAL | 0 refills | Status: DC

## 2019-10-27 MED ORDER — OXYCODONE-ACETAMINOPHEN 5-325 MG PO TABS: 2.0000 | ORAL_TABLET | ORAL | 0 refills | Status: DC | PRN

## 2019-10-27 NOTE — Progress Notes (Signed)
CSW attempted to visit MOB at bedside, however, MOB has already discharged. CSW unable to reassess for needs or resources. MOB was provided the following information yesterday.   CSW provided education regarding the baby blues period vs. perinatal mood disorders, discussed treatment and gave resources for mental health follow up if concerns arise.  CSW recommends self-evaluation during the postpartum time period using the New Mom Checklist from Postpartum Progress and encouraged MOB to contact a medical professional if symptoms are noted at any time. MOB stated understanding and denied any questions.    See yesterday's CSW 12:57pm note for additional details.   Teresa Reed D. Dortha Kern, MSW, [facility] Clinical Social Worker [phone]

## 2019-10-27 NOTE — Lactation Note (Signed)
This note was copied from a baby's chart. Lactation Consultation Note  Patient Name: Teresa Reed Date: 10/27/2019   [facility] entered room.  Mom on  Cell phone.  Mom asked what io wanted.  Introduced myself.  She said they were fine with breastfeeding.  Didn't need anything.  Urged to call lactation as needed  Maternal Data    Feeding    LATCH Score                   Interventions    Lactation Tools Discussed/Used     Consult Status      Neomia Dear 10/27/2019, 10:22 PM

## 2019-10-27 NOTE — Progress Notes (Signed)
   10/27/19 0850  Edinburgh Postnatal Depression Scale:  In the Past 7 Days  I have been able to laugh and see the funny side of things. 1  I have looked forward with enjoyment to things. 1  I have blamed myself unnecessarily when things went wrong. 3  I have been anxious or worried for no good reason. 3  I have felt scared or panicky for no good reason. 2  Things have been getting on top of me. 2  I have been so unhappy that I have had difficulty sleeping. 3  I have felt sad or miserable. 2  I have been so unhappy that I have been crying. 3  The thought of harming myself has occurred to me. 1  Edinburgh Postnatal Depression Scale Total 21  Pt already had ss consult however, this RN informed social worker of the score and that the thought of harming herself occurred.

## 2019-10-31 ENCOUNTER — Encounter: Payer: Self-pay | Admitting: Obstetrics and Gynecology

## 2019-10-31 ENCOUNTER — Other Ambulatory Visit: Payer: Self-pay

## 2019-10-31 ENCOUNTER — Ambulatory Visit (INDEPENDENT_AMBULATORY_CARE_PROVIDER_SITE_OTHER): Payer: Medicaid Other | Admitting: Obstetrics and Gynecology

## 2019-10-31 DIAGNOSIS — Z98891 History of uterine scar from previous surgery: Secondary | ICD-10-CM

## 2019-10-31 NOTE — Progress Notes (Signed)
    Post Cesarean Section Incision Check  Teresa Reed is a 29 y.o. African-American [phone] (Patient's last menstrual period was 02/18/2019.) who is s/p RCS on 10/25/19 at 37 weeks for h/o classical c-section. She was discharged to home on POD#2. Pregnancy complicated by h/o classical.  Complains of pain at incision site, overall soreness but not needing pain medication. Denies other complaints, overall she is feeling well.   Physical Exam:  BP 114/70   Pulse 70   Wt 198 lb (89.8 kg)   LMP 02/18/2019   Breastfeeding Yes   BMI 35.07 kg/m  Body mass index is 35.07 kg/m. General appearance: Well nourished, well developed female in no acute distress.  Abdomen: soft, appropriately tender, incision clean, dry, intact with no signs of active bleeding or infection   Assessment: Patient is a 29 y.o. [id number] who is 1 week post partum from a repeat c-section. She is doing well. Incision appears to be healing appropriately   Plan:  Follow up 3 weeks for post partum visit.   Baldemar Lenis, M.D. Attending Center for [employer] [facility])

## 2019-11-06 ENCOUNTER — Telehealth: Payer: Self-pay | Admitting: *Deleted

## 2019-11-06 ENCOUNTER — Encounter: Payer: Self-pay | Admitting: *Deleted

## 2019-11-06 NOTE — Telephone Encounter (Signed)
Received a call from pregnancy care manager Marcelino Duster stating that pt had scored 21 on Edinburgh scale today.  She is currently on Zoloft 50 mg daily.  She had appts scheduled with our Operating Room Services specialist due to her depression but N/S'd the appts.  She has another 31 yo child with special needs.  She told the pregnancy care manager that she was not thinking of harming herself or her babies.  I have sent a message to Dr Earlene Plater who saw pt 10/31/19 for advice.  I have called the patient via phone but the mailbox was full so I sent her a my chart message to call the office.

## 2019-11-07 ENCOUNTER — Encounter: Payer: Self-pay | Admitting: Obstetrics and Gynecology

## 2019-11-07 ENCOUNTER — Telehealth (INDEPENDENT_AMBULATORY_CARE_PROVIDER_SITE_OTHER): Payer: Medicaid Other | Admitting: Obstetrics and Gynecology

## 2019-11-07 DIAGNOSIS — O99345 Other mental disorders complicating the puerperium: Secondary | ICD-10-CM | POA: Diagnosis not present

## 2019-11-07 DIAGNOSIS — F53 Postpartum depression: Secondary | ICD-10-CM | POA: Diagnosis not present

## 2019-11-07 DIAGNOSIS — F419 Anxiety disorder, unspecified: Secondary | ICD-10-CM | POA: Diagnosis not present

## 2019-11-07 MED ORDER — ESCITALOPRAM OXALATE 10 MG PO TABS: 10.0000 mg | ORAL_TABLET | Freq: Every day | ORAL | 2 refills | Status: DC
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
## 2019-11-22 ENCOUNTER — Ambulatory Visit: Payer: Medicaid Other | Admitting: Certified Nurse Midwife

## 2019-11-25 ENCOUNTER — Encounter: Payer: Medicaid Other | Admitting: Obstetrics & Gynecology

## 2019-11-26 ENCOUNTER — Ambulatory Visit (INDEPENDENT_AMBULATORY_CARE_PROVIDER_SITE_OTHER): Payer: Medicaid Other | Admitting: Clinical

## 2019-11-26 NOTE — BH Specialist Note (Signed)
Pt requests to call back and reschedule at a later date, due to schedule conflict today. Pt is informed that contact information will be sent to her via MyChart, to use when she is ready to schedule.   Integrated Behavioral Health via Telemedicine Video [facility]) Visit  11/26/2019 Teresa Reed [id number]  Rae Lips

## 2019-11-28 ENCOUNTER — Ambulatory Visit: Payer: Medicaid Other | Admitting: Certified Nurse Midwife

## 2020-02-05 ENCOUNTER — Ambulatory Visit: Payer: Medicaid Other | Admitting: Women's Health
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
## 2020-02-11 ENCOUNTER — Ambulatory Visit (INDEPENDENT_AMBULATORY_CARE_PROVIDER_SITE_OTHER): Payer: Medicaid Other | Admitting: Clinical

## 2020-02-11 ENCOUNTER — Other Ambulatory Visit: Payer: Self-pay

## 2020-02-11 DIAGNOSIS — F4310 Post-traumatic stress disorder, unspecified: Secondary | ICD-10-CM | POA: Diagnosis not present

## 2020-02-11 DIAGNOSIS — Z6981 Encounter for mental health services for victim of other abuse: Secondary | ICD-10-CM
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
## 2020-02-14 ENCOUNTER — Ambulatory Visit: Payer: Medicaid Other | Admitting: Certified Nurse Midwife

## 2020-02-14 ENCOUNTER — Encounter: Payer: Self-pay | Admitting: Certified Nurse Midwife

## 2020-02-14 ENCOUNTER — Other Ambulatory Visit: Payer: Self-pay

## 2020-02-14 VITALS — BP 116/67 | HR 65 | Wt 183.0 lb

## 2020-02-14 DIAGNOSIS — F419 Anxiety disorder, unspecified: Secondary | ICD-10-CM | POA: Diagnosis not present

## 2020-02-14 DIAGNOSIS — R399 Unspecified symptoms and signs involving the genitourinary system: Secondary | ICD-10-CM

## 2020-02-14 DIAGNOSIS — T7491XS Unspecified adult maltreatment, confirmed, sequela: Secondary | ICD-10-CM

## 2020-02-14 LAB — POCT URINALYSIS DIPSTICK
Bilirubin, UA: NEGATIVE
Glucose, UA: NEGATIVE
Ketones, UA: NEGATIVE
Nitrite, UA: NEGATIVE
Protein, UA: NEGATIVE
Spec Grav, UA: 1.01 (ref 1.010–1.025)
Urobilinogen, UA: 0.2 E.U./dL
pH, UA: 5 (ref 5.0–8.0)
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
## 2020-02-15 LAB — URINE CULTURE
MICRO NUMBER:: 11077403
Result:: NO GROWTH
SPECIMEN QUALITY:: ADEQUATE

## 2020-02-25 ENCOUNTER — Ambulatory Visit (INDEPENDENT_AMBULATORY_CARE_PROVIDER_SITE_OTHER): Payer: 59 | Admitting: Clinical

## 2020-02-25 DIAGNOSIS — F4310 Post-traumatic stress disorder, unspecified: Secondary | ICD-10-CM | POA: Diagnosis not present

## 2020-03-02 ENCOUNTER — Telehealth (HOSPITAL_COMMUNITY): Payer: Medicaid Other | Admitting: Psychiatry

## 2020-03-18 ENCOUNTER — Telehealth: Payer: Self-pay | Admitting: Clinical

## 2020-03-18 NOTE — Telephone Encounter (Signed)
Pt is frustrated and scared that she will lose her job without seeing a psychiatrist soon (to fill out paperwork). Pt is uncertain what to do, as her employer says that paperwork must be filled out by a psychiatrist, and she will not be able to get an initial appointment in before her employer's deadline of 03/22/20.   Pt is going to contact her employer and see if they will extend the time needed to complete the paperwork, to allow her additional time to see psychiatry.   It is recommended that pt contact [facility] for [facility] to see if there are other options; is aware there is no guarantee that her employer will extend her time, nor is there a guarantee that she will obtain an appointment with psychiatry prior to her employer's deadline.

## 2020-03-19 ENCOUNTER — Telehealth: Payer: Self-pay | Admitting: Clinical

## 2020-03-19 NOTE — Telephone Encounter (Addendum)
Pt called, "I don't know what to do" regarding seeing psychiatry. Pt was referred to [facility] on 02/11/20 and again on 02/14/20. These referrals have been "closed" due to "patient refused service"; pt says she did not refuse service, but was confused when she received the call about the referral, as she didn't know the doctor she would see. Pt was scheduled for 03/02/20 for virtual visit; pt was unaware that she had been scheduled for the visit and that it was virtual. At the next attempt, pt says she was frustrated and frantically trying to get in to see psychiatrist before her employers deadline for psychiatrist to complete paperwork to extend her maternity leave, so that she would not lose her job. Her employer has extended her time an extra 7 days only.   Pt is aware that it is unlikely she will get an appointment to see a psychiatrist within the next 7 days, so is encouraged to go to [facility] walk-in 24/7 if she feels she is having a behavioral health emergency; pt is not suicidal nor homicidal, but "feels like I'm going to lose it", worries about losing her children and that going to a psychiatrist "might look bad" in the courts, wrote down the physical address, and says she will consider going.   Pt agrees for [facility] to call in one week to check on her wellbeing at that time.

## 2020-03-25 ENCOUNTER — Telehealth: Payer: Self-pay | Admitting: Clinical

## 2020-03-25 DIAGNOSIS — F4310 Post-traumatic stress disorder, unspecified: Secondary | ICD-10-CM

## 2020-03-25 NOTE — Telephone Encounter (Addendum)
Attempt to f/u with patient, as agreed-upon; Left HIPPA-compliant message to call back Asher Muir from [employer] for [employer] at [facility] at [phone] [facility] office).   Pt returned call; was unable to go to [facility] walk-in clinic, as she has nobody to watch children during the walk-in clinic hours, and says she has left voice messages at [facility] to see if the 03/31/20 time slot was still available and has received no call back.   [facility] suggested going into [facility] in [place] for walk-in clinic or calling the [facility] number to see if there are other options for getting in to see psychiatry. Pt says "I don't know, I don't know, but I'll just figure it out". Pt says she thinks she may turn in her notice, as she cannot go back to work now, and she's confused about the paperwork her workplace is requesting.   Pt says she is the only financial support in her household, and does not feel emotionally well enough to return to work. When asked if she has applied for EBT, or whether she needs any referrals to help during this difficult financial time, she says she does not want that.   [facility] asks if she would like another referral to be put in, pt says "I don't know, I just don't know, I don't know what I'm going to do, I don't know what they want from me, I'm so confused". Pt then agrees for [facility] to put in another referral.

## 2020-03-25 NOTE — Addendum Note (Signed)
Addended by: Hulda Marin C on: 03/25/2020 03:06 PM   Modules accepted: Orders

## 2020-04-13 ENCOUNTER — Encounter: Payer: Self-pay | Admitting: General Practice

## 2020-04-29 ENCOUNTER — Encounter: Payer: Self-pay | Admitting: Certified Nurse Midwife

## 2020-05-26 ENCOUNTER — Encounter (HOSPITAL_COMMUNITY): Payer: Self-pay | Admitting: Psychiatry

## 2020-05-26 ENCOUNTER — Telehealth (INDEPENDENT_AMBULATORY_CARE_PROVIDER_SITE_OTHER): Payer: 59 | Admitting: Psychiatry

## 2020-05-26 DIAGNOSIS — Z639 Problem related to primary support group, unspecified: Secondary | ICD-10-CM | POA: Diagnosis not present

## 2020-05-26 DIAGNOSIS — F331 Major depressive disorder, recurrent, moderate: Secondary | ICD-10-CM

## 2020-05-26 DIAGNOSIS — F4310 Post-traumatic stress disorder, unspecified: Secondary | ICD-10-CM

## 2020-05-26 MED ORDER — BUPROPION HCL ER (SR) 100 MG PO TB12: 100.0000 mg | ORAL_TABLET | Freq: Every day | ORAL | 0 refills | Status: DC
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
## 2020-06-23 ENCOUNTER — Encounter (HOSPITAL_COMMUNITY): Payer: Self-pay | Admitting: Psychiatry

## 2020-06-23 ENCOUNTER — Telehealth (INDEPENDENT_AMBULATORY_CARE_PROVIDER_SITE_OTHER): Payer: Managed Care, Other (non HMO) | Admitting: Psychiatry

## 2020-06-23 DIAGNOSIS — F331 Major depressive disorder, recurrent, moderate: Secondary | ICD-10-CM

## 2020-06-23 DIAGNOSIS — Z639 Problem related to primary support group, unspecified: Secondary | ICD-10-CM

## 2020-06-23 DIAGNOSIS — F900 Attention-deficit hyperactivity disorder, predominantly inattentive type: Secondary | ICD-10-CM

## 2020-06-23 MED ORDER — AMPHETAMINE-DEXTROAMPHETAMINE 5 MG PO TABS: 5.0000 mg | ORAL_TABLET | Freq: Every day | ORAL | 0 refills | Status: DC

## 2020-06-23 MED ORDER — BUPROPION HCL ER (SR) 100 MG PO TB12: 100.0000 mg | ORAL_TABLET | Freq: Every day | ORAL | 0 refills | Status: DC
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
## 2020-07-29 ENCOUNTER — Telehealth (HOSPITAL_COMMUNITY): Payer: Medicaid Other | Admitting: Psychiatry

## 2020-09-11 ENCOUNTER — Telehealth (HOSPITAL_COMMUNITY): Payer: Self-pay

## 2020-09-11 MED ORDER — AMPHETAMINE-DEXTROAMPHETAMINE 5 MG PO TABS
5.0000 mg | ORAL_TABLET | Freq: Every day | ORAL | 0 refills | Status: DC
Start: 2020-09-11 — End: 2021-02-19

## 2020-09-11 MED ORDER — BUPROPION HCL ER (SR) 100 MG PO TB12: 100.0000 mg | ORAL_TABLET | Freq: Every day | ORAL | 0 refills | Status: DC

## 2020-09-11 NOTE — Telephone Encounter (Signed)
Patient has an appt on Monday but needs refills on her meds today. Wellbutrin and Adderall. [facility] on [address] in [facility]

## 2020-09-11 NOTE — Telephone Encounter (Signed)
Ok sent!

## 2020-09-14 ENCOUNTER — Telehealth (INDEPENDENT_AMBULATORY_CARE_PROVIDER_SITE_OTHER): Payer: Medicaid Other | Admitting: Psychiatry

## 2020-09-14 ENCOUNTER — Encounter (HOSPITAL_COMMUNITY): Payer: Self-pay | Admitting: Psychiatry

## 2020-09-14 DIAGNOSIS — F331 Major depressive disorder, recurrent, moderate: Secondary | ICD-10-CM | POA: Diagnosis not present

## 2020-09-14 DIAGNOSIS — F900 Attention-deficit hyperactivity disorder, predominantly inattentive type: Secondary | ICD-10-CM

## 2020-09-14 DIAGNOSIS — Z639 Problem related to primary support group, unspecified: Secondary | ICD-10-CM

## 2020-09-14 MED ORDER — SERTRALINE HCL 50 MG PO TABS: 50.0000 mg | ORAL_TABLET | Freq: Every day | ORAL | 1 refills | Status: DC
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
## 2020-10-08 ENCOUNTER — Other Ambulatory Visit
Admission: RE | Admit: 2020-10-08 | Discharge: 2020-10-08 | Disposition: A | Discharge status: Home / Self Care | Payer: Medicaid Other | Attending: Plastic Surgery | Admitting: Plastic Surgery

## 2020-10-08 DIAGNOSIS — Z01818 Encounter for other preprocedural examination: Secondary | ICD-10-CM | POA: Insufficient documentation

## 2020-10-08 LAB — APTT: aPTT: 28 seconds (ref 24–36)

## 2020-10-08 LAB — BASIC METABOLIC PANEL
Anion gap: 7 (ref 5–15)
BUN: 12 mg/dL (ref 6–20)
CO2: 23 mmol/L (ref 22–32)
Calcium: 9.2 mg/dL (ref 8.9–10.3)
Chloride: 107 mmol/L (ref 98–111)
Creatinine, Ser: 0.66 mg/dL (ref 0.44–1.00)
GFR, Estimated: >60 mL/min (ref >60)
Glucose, Bld: 98 mg/dL (ref 70–99)
Potassium: 4.1 mmol/L (ref 3.5–5.1)
Sodium: 137 mmol/L (ref 135–145)

## 2020-10-08 LAB — HIV ANTIBODY (ROUTINE TESTING W REFLEX): HIV Screen 4th Generation wRfx: NONREACTIVE

## 2020-10-08 LAB — PROTIME-INR
INR: 1.2 (ref 0.8–1.2)
Prothrombin Time: 15.2 seconds (ref 11.4–15.2)

## 2020-10-08 LAB — CBC
HCT: 35.2 % — ABNORMAL LOW (ref 36.0–46.0)
Hemoglobin: 11.6 g/dL — ABNORMAL LOW (ref 12.0–15.0)
MCH: 27.1 pg (ref 26.0–34.0)
MCHC: 33 g/dL (ref 30.0–36.0)
MCV: 82.2 fL (ref 80.0–100.0)
Platelets: 433 10*3/uL — ABNORMAL HIGH (ref 150–400)
RBC: 4.28 MIL/uL (ref 3.87–5.11)
RDW: 13.4 % (ref 11.5–15.5)
WBC: 10.2 10*3/uL (ref 4.0–10.5)
nRBC: 0 % (ref 0.0–0.2)

## 2020-10-08 LAB — HEPATITIS C ANTIBODY: HCV Ab: NONREACTIVE

## 2020-10-08 LAB — HCG, QUANTITATIVE, PREGNANCY: hCG, Beta Chain, Quant, S: <1 m[IU]/mL (ref <5)

## 2020-10-22 ENCOUNTER — Telehealth (HOSPITAL_COMMUNITY): Payer: Medicaid Other | Admitting: Psychiatry

## 2020-12-29 IMAGING — US US MFM OB DETAIL+14 WK
1 series · 15 of 28 positions shown · non-contrast
Comparison: none

[Series 1: us mfm ob detail+14 wk · 15 of 93 slices shown]
[im 1/93]
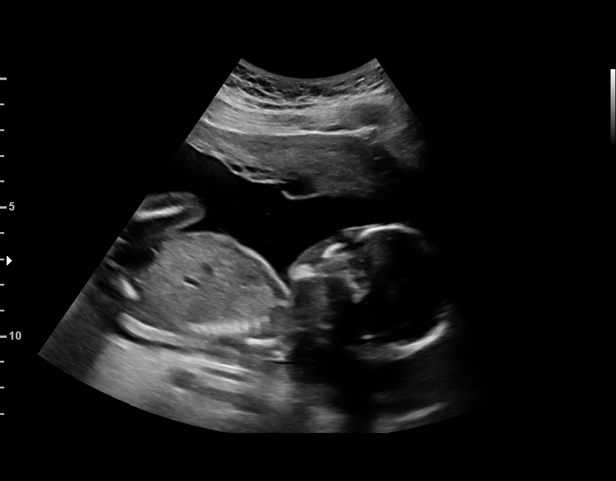
[im 7/93]
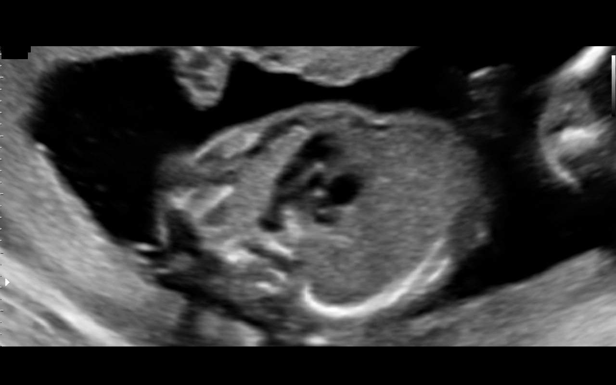
[im 14/93]
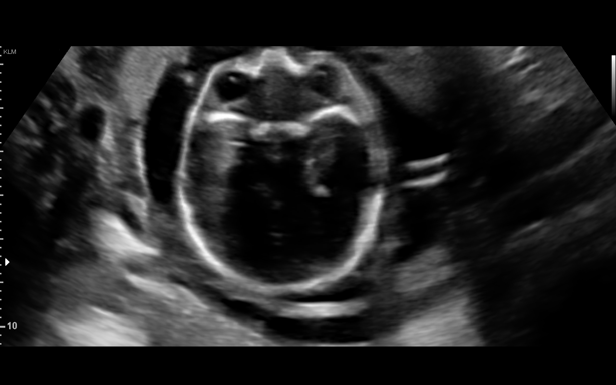
[im 21/93]
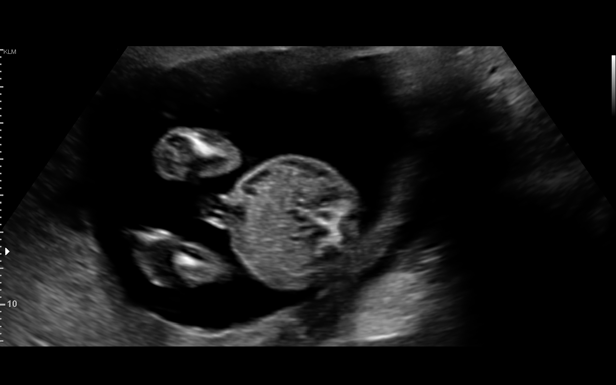
[im 28/93]
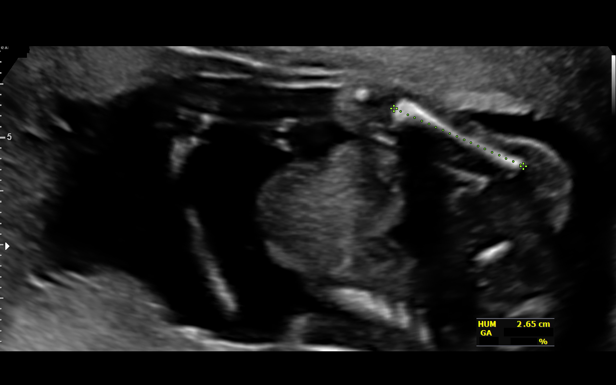
[im 35/93]
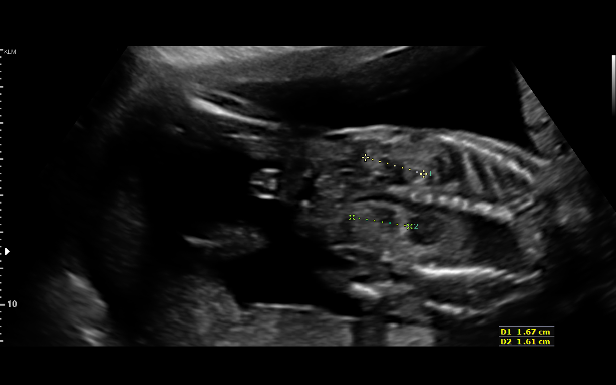
[im 41/93]
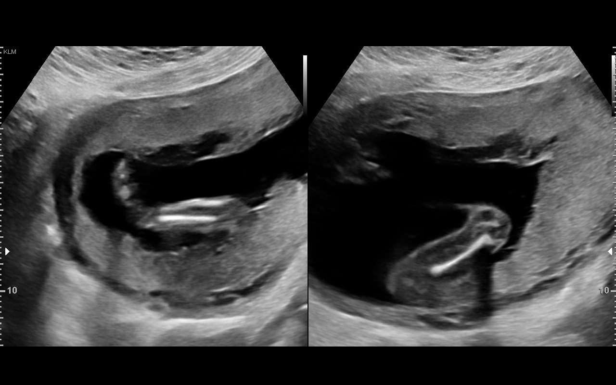
[im 48/93]
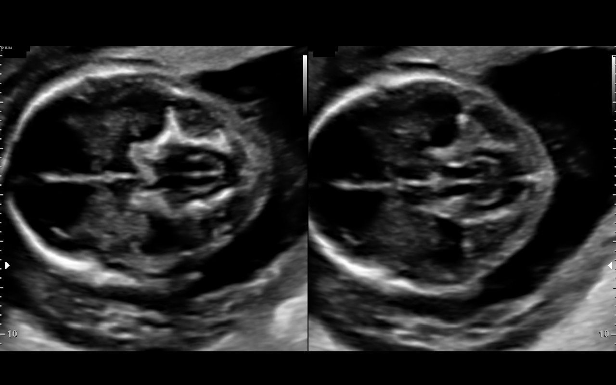
[im 52/93]
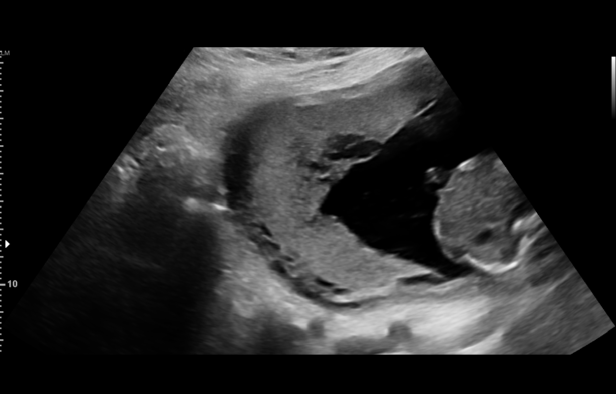
[im 58/93]
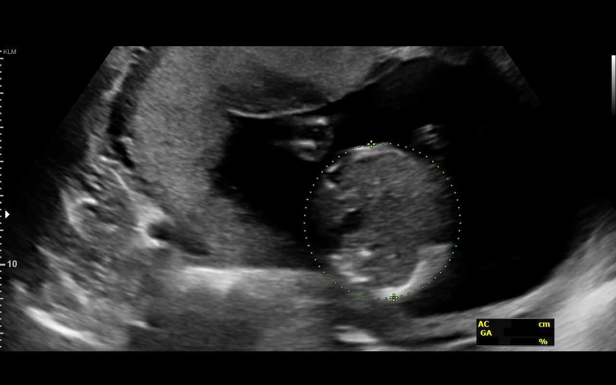
[im 65/93]
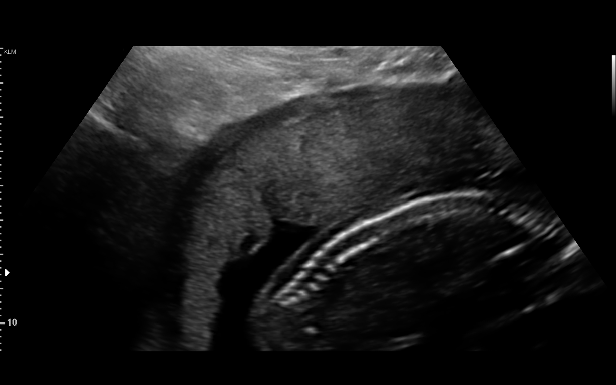
[im 72/93]
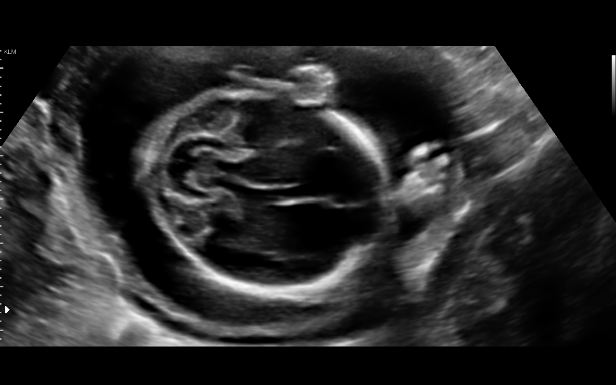
[im 79/93]
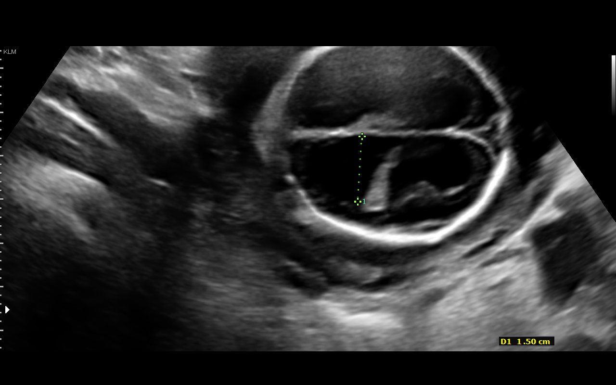
[im 86/93]
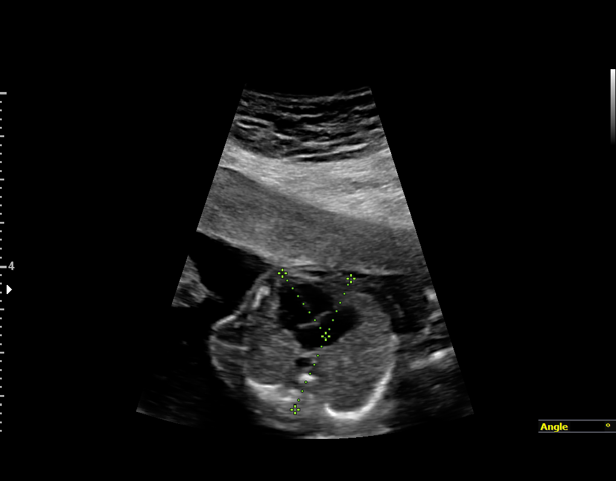
[im 93/93]
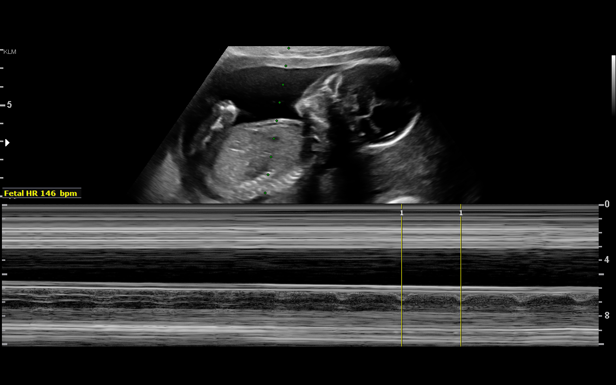

[15 of 28 positions shown; findings below may reference images not displayed]

2  US MFM AMNIOCENTESIS                 76946.01     MARKKU GODENHJELM
 ----------------------------------------------------------------------

 ----------------------------------------------------------------------
Indications

  Encounter for antenatal screening for
  malformations (low risk NIPS)
  19 weeks gestation of pregnancy
  Fetal abnormality - other known or suspected
 ----------------------------------------------------------------------
Fetal Evaluation

 Num Of Fetuses:         1
 Fetal Heart Rate(bpm):  142
 Cardiac Activity:       Observed
 Presentation:           Cephalic
 Placenta:               Fundal
 P. Cord Insertion:      Visualized

 Amniotic Fluid
 AFI FV:      Within normal limits
Biometry

 BPD:      46.8  mm     G. Age:  20w 1d         87  %    CI:        80.28   %    70 - 86
                                                         FL/HC:      16.5   %    16.1 -
 HC:       165   mm     G. Age:  19w 1d         47  %    HC/AC:      1.24        1.09 -
 AC:      133.6  mm     G. Age:  18w 6d         36  %    FL/BPD:     58.3   %
 FL:       27.3  mm     G. Age:  18w 2d         18  %    FL/AC:      20.4   %    20 - 24
 HUM:      26.6  mm     G. Age:  18w 3d         31  %

 Est. FW:     254  gm      0 lb 9 oz     37  %
OB History
 Gravidity:    3         Term:   1        Prem:   0        SAB:   1
 TOP:          0       Ectopic:  0        Living: 1
Gestational Age

 LMP:           19w 1d        Date:  02/12/18                 EDD:   11/19/18
 U/S Today:     19w 1d                                        EDD:   11/19/18
 Best:          19w 1d     Det. By:  LMP  (02/12/18)          EDD:   11/19/18
Anatomy

 Cranium:               Appears normal         Aortic Arch:            Appears normal
 Cavum:                 Absent                 Ductal Arch:            Appears normal
 Ventricles:            Abnormal, see          Diaphragm:              Appears normal
                        comments
 Choroid Plexus:        Abnormal, see          Stomach:                Appears normal, left
                        comments
                                                                       sided
 Cerebellum:            Abnormal, see          Abdomen:                Appears normal
                        comments
 Posterior Fossa:       Abnormal, see          Abdominal Wall:         Appears nml (cord
                        comments
                                                                       insert, abd wall)
 Face:                  Appears normal         Cord Vessels:           Appears normal (3
                        (orbits and profile)                           vessel cord)
 Lips:                  Appears normal         Kidneys:                Appear normal
 Thoracic:              Appears normal         Bladder:                Appears normal
 Heart:                 Appears normal         Spine:                  Appears normal
                        (4CH, axis, and
                        situs)
 RVOT:                  Appears normal         Upper Extremities:      Appears normal
 LVOT:                  Appears normal         Lower Extremities:      Appears normal

 Other:  Fetus appears to be a male. Heels visualized. Nasal bone visualized.
Guided Procedures

 Type:   Amniocentesis

 FH Post Procedure:     Normal             RH Type:          O positive
 Rh Immune Globulin:    Not required,      Discharge Inst.:  Post-procedure
                        Rh positive                          instructions
                                                             given
 Needle Insertions:     20 gauge x 1       Vol. Withdrawn:   32 ml of clear
                                                             amniotic fluid
 Catheter Passes:       1 pass
 Transabdominal:        Yes

 Complications:  None

 Comment:                    Informed consent was obtained. A "time-out" was performed before the procedure. Patient
                             tolerated the procedure well. We gave her post-procedure instructions.
Cervix Uterus Adnexa

 Cervix
 Length:            3.9  cm.
 Normal appearance by transabdominal scan.
Impression

 Ms. Hincapie, Immanuel3 OOAOO at 19-weeks' gestation, is here for fetal
 anatomy scan. On cell-free fetal DNA screening, the risks of
 fetal aneuploidies are not increased. MSAFP screening result
 is pending.
 Patient reports no chronic medical conditions.

 Obstetric history is significant for a term vaginal delivery in
 4331 of a male infant weighing 3,600 grams at birth. Her son
 is in good health.

 We performed a fetal anatomy scan. Fetal biometry is
 consistent with her previously-established dates. Amniotic
 fluid is normal and good fetal activity is seen. Following
 findings are seen:
 -Bilateral severe ventriculomegaly, measuring 15 millimeters,
 are seen.
 -Midline appears normal.
 -The CSP is not seen and frontal horns are dilated.
 -The thalami do not appear fused, but I cannot clearly rule
 that out.
 -No calcifications or masses are seen in the intracranial
 cavity.
 -Posterior fossa appears abnormal with a small cerebellum
 (positional); however, the gestational age could be a factor.
 The cisterna magna is normal and not obliterated.
 -Fetal spine was carefully evaluated in sagittal, coronal and
 transverse views and it appears normal.
 -Cardiac anatomy appears normal.
 -Rest of fetal anatomy appears normal.

 Differential diagnoses include agenesis of corpus callosum,
 lobar holoprosencephaly. I counseled the couple that
 ventriculomegaly can result from variety of causes including
 chromosomal anomalies, genetic syndromes, fetal infections
 and structural anomalies.
 I informed the couple that fetal brain MRI (preferably
 performed at 21 weeks) will help identifying brain anomalies.

 I also informed the couple that the likelihood of chromosomal
 anomalies is increased in fetuses with severe
 ventriculomegaly (up to 10%). I recommended amniocentesis.
 I explained the procedure and possible complications
 including miscarriage (1 in 400 procedures).
 After counseling, the patient opted to have amniocentesis.

 After informed consent, amniocentesis was performed by Dr.
 Mosarof under ultrasound guidance and 32 milliliters of clear
 amniotic fluid was withdrawn (initial 2 mL of amniotic fluid
 was discarded). Fluid was sent to [REDACTED] for AFAFP, FISH,
 fetal karyotype and microarray analysis. Patient tolerated the
 procedure well. Post-procedure fetal heart rate was normal.
 We gave her post-procedure instructions.

 We briefly discussed termination of pregnancy. Patient will
 continue her pregnancy and termination is not an option now.
 xxxxxxxxxxxxxxxxxxxxxxxxxxxxxxxxxxxxxxxxxxxxxxxxxxxxxxxxx
 xxxxxxxxxxxxxxxxxxxxxxxxx[id number]

 Ms. Kenney returned to discuss ultrasound findings. She felt
 overwhelmed after initial ultrasound 3 days ago. Patient
 informed that she would like to discuss the findings with me
 again and we made an appointment for her to meet me in my
 office today.

 After the patient's visit on 06/26/18, I reviewed the images
 again and literature. Important findings include:
 -Bilateral severe ventriculomegaly.
 -Absent CSP.
 -Hypoplastic fused cerebellum with possible absence of
 vermis.

 There is a possibility that these findings lead to the rare
 diagnosis of rhomencepalosynapsis that includes fused
 cerebellum, ventriculomegaly and absent corpus callosum in
 some cases.
 Diagnosis is not certain on ultrasound alone and MRI has
 been scheduled at [facility].

 I explained the findings with help of diagram, her ultrasound
 images and normal fetal brain on ultrasound (google images).
 I explained the important findings including ventriculomegaly,
 absent CSP and small cerebellum.
 I discussed the possible causes of ventriculomegaly including
 chromosomal, genetic conditions, infection and structural
 brain anomalies.

 Amniocentesis FISH result is normal and I informed the
 patient (showed her the result).

 I also reassured her that we will make appointments with
 pediatric neurologist/neurosurgeon after MRI. If delivery is
 recommended at [facility], appropriate referrals will be made.

 Patient was in a better frame of mind today and was able to
 ask appropriate questions. She understands that accurate
 diagnosis is dependent on ultrasound, MRI and
 amniocentesis. However, correct diagnosis may not be
 possible until after birth and, therefore, giving prognosis may
 be challenging.

 She is firm in continuing her pregnancy and termination of
 pregnancy is not an option.
Recommendations

 -We will communicate the results to the patient and scan
 copies into EMR.
 -We will set up appointment for fetal brain MRI.
 -We will set up fetal echocardiography appointment (2 to 4
 weeks).
 -Genetic counseling after MRI and amniocentesis results.
 -Follow-up scan in 4 weeks.
                      Sison, Dixie

## 2021-01-26 IMAGING — US US MFM OB FOLLOW UP
1 series · 13 of 28 positions shown · non-contrast
Comparison: none

[Series 1: us mfm ob follow up · 80 acquisitions, 13 frames shown]
[im 3/80]
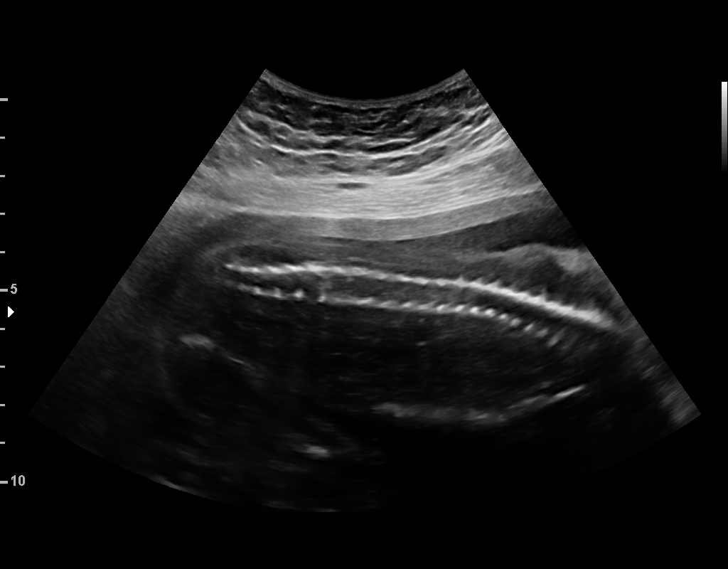
[im 9/80]
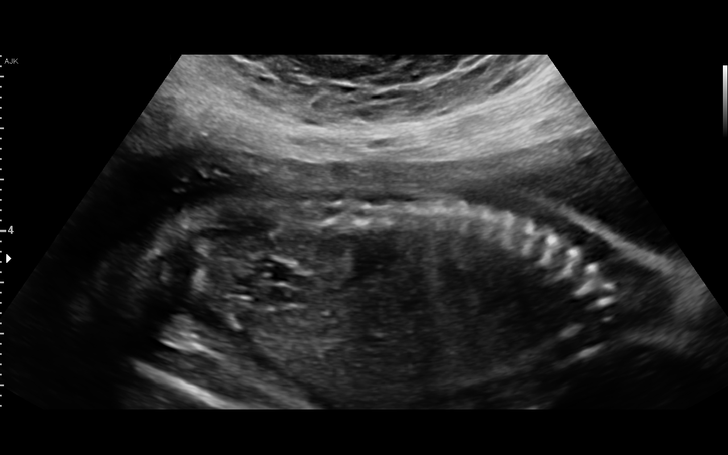
[im 15/80]
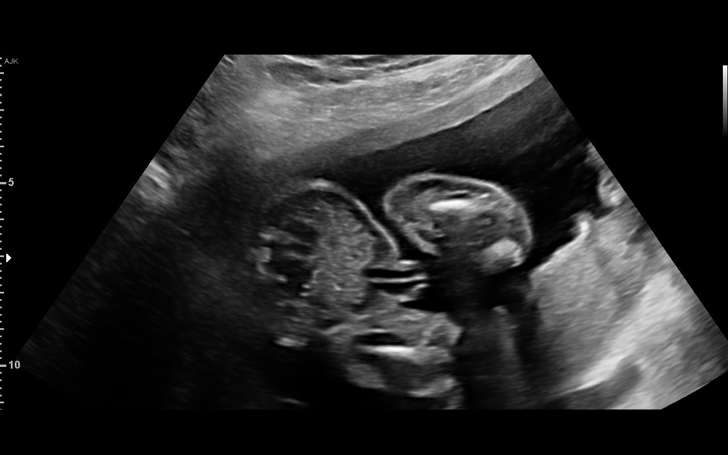
[im 21/80]
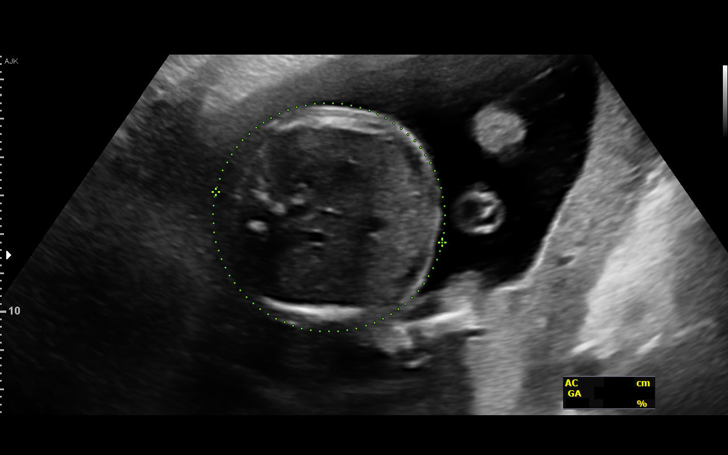
[im 27/80]
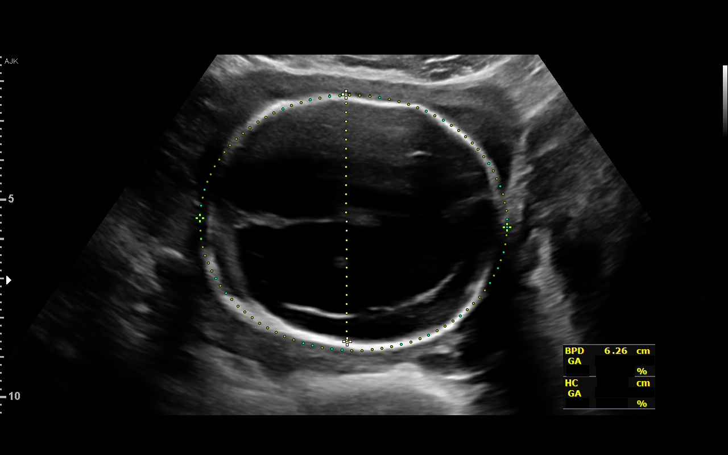
[im 33/80]
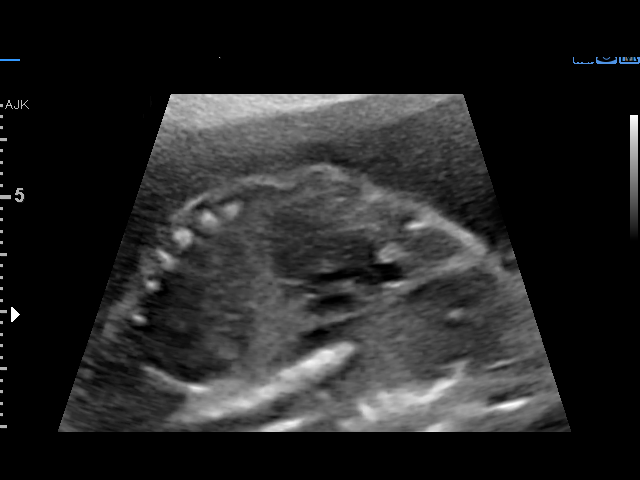
[im 41/80]
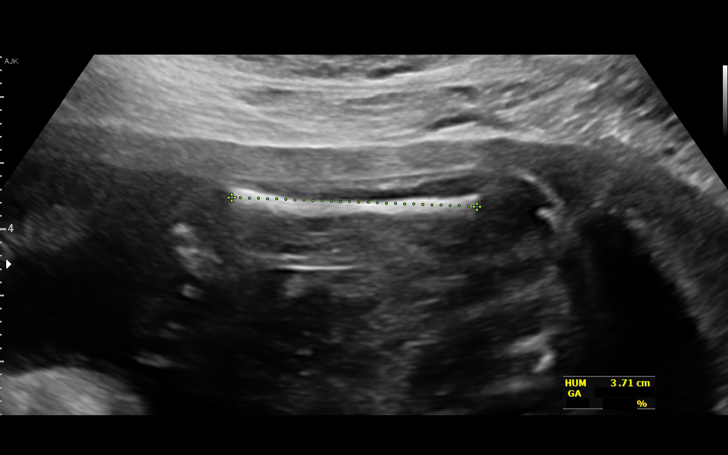
[im 47/80]
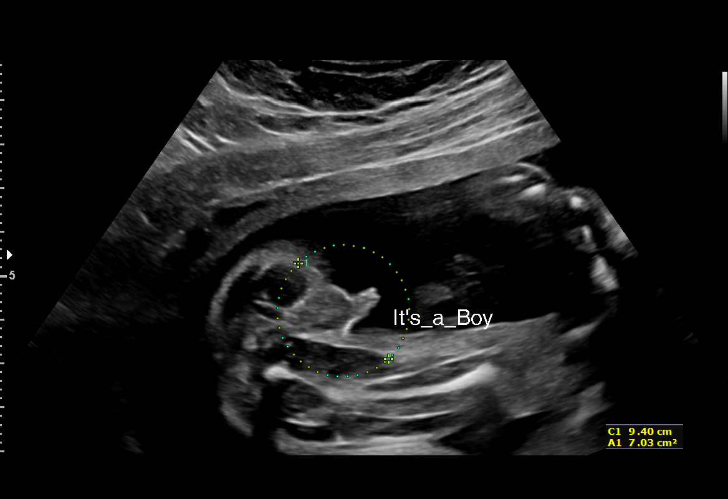
[im 53/80]
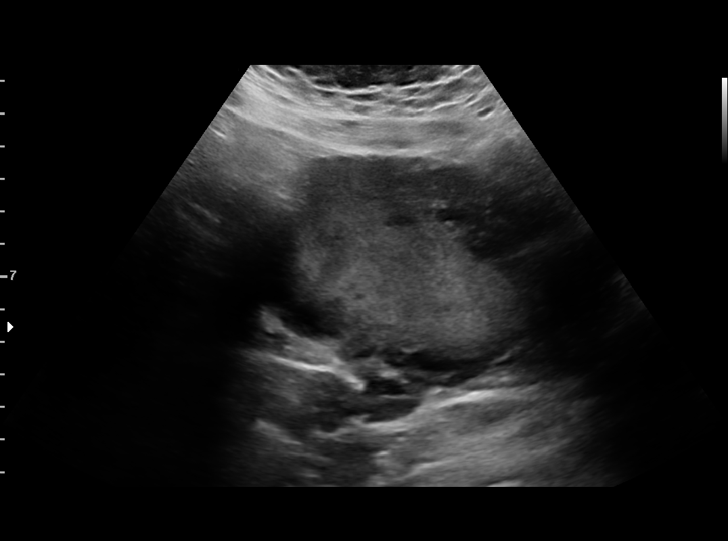
[im 59/80]
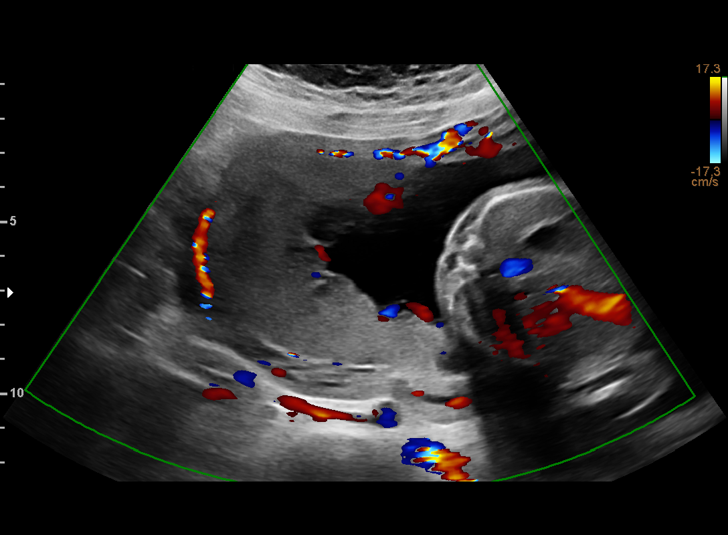
[im 65/80]
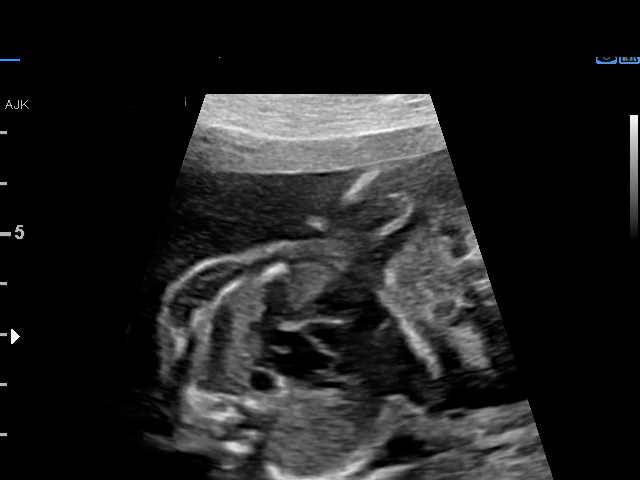
[im 71/80]
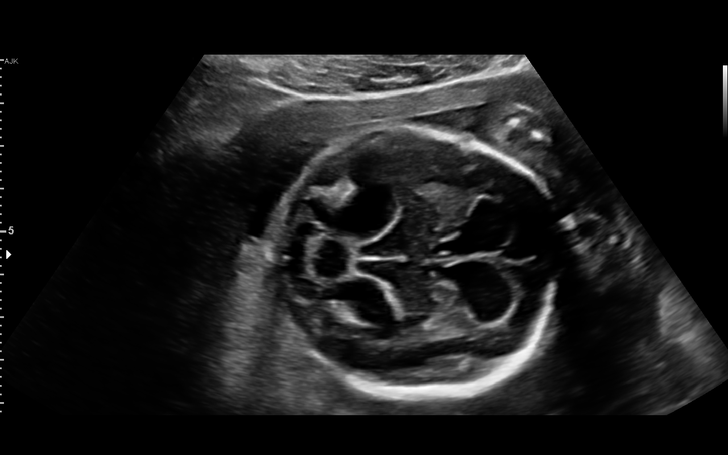
[im 77/80]
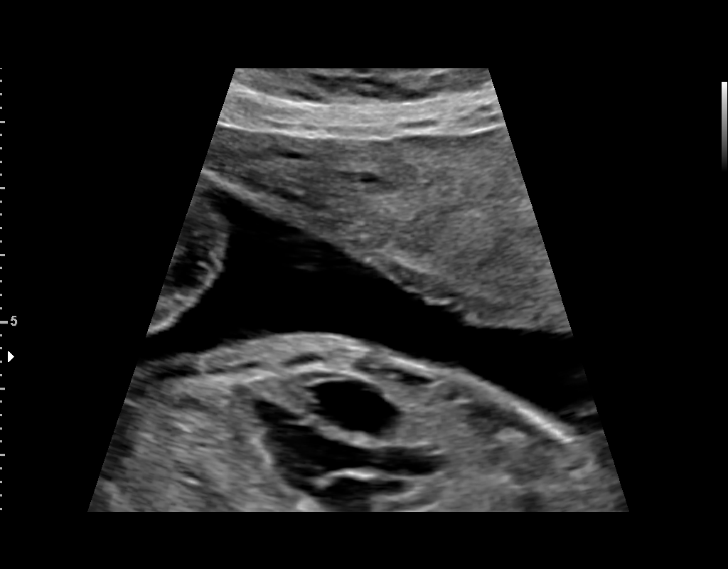

[13 of 28 positions shown; findings below may reference images not displayed]

----------------------------------------------------------------------

 ----------------------------------------------------------------------
Indications

  Fetal abnormality - other known or suspected
  Encounter for other antenatal screening
  follow-up (low risk NIPs)
  23 weeks gestation of pregnancy
 ----------------------------------------------------------------------
Fetal Evaluation

 Num Of Fetuses:         1
 Fetal Heart Rate(bpm):  151
 Cardiac Activity:       Observed
 Presentation:           Cephalic
 Placenta:               Posterior

 Amniotic Fluid
 AFI FV:      Within normal limits

                             Largest Pocket(cm)

Biometry

 BPD:      62.5  mm     G. Age:  25w 2d         98  %    CI:        78.96   %    70 - 86
                                                         FL/HC:      17.0   %    19.2 -
 HC:      222.4  mm     G. Age:  24w 2d         78  %    HC/AC:      1.20        1.05 -
 AC:      186.1  mm     G. Age:  23w 3d         50  %    FL/BPD:     60.6   %    71 - 87
 FL:       37.9  mm     G. Age:  22w 1d         12  %    FL/AC:      20.4   %    20 - 24
 HUM:      36.5  mm     G. Age:  22w 5d         33  %

 Est. FW:     561  gm      1 lb 4 oz     52  %
OB History
 Gravidity:    3         Term:   1        Prem:   0        SAB:   1
 TOP:          0       Ectopic:  0        Living: 1
Gestational Age

 LMP:           23w 1d        Date:  02/12/18                 EDD:   11/19/18
 U/S Today:     23w 6d                                        EDD:   11/14/18
 Best:          23w 1d     Det. By:  LMP  (02/12/18)          EDD:   11/19/18
Anatomy

 Cranium:               Appears normal         Aortic Arch:            Previously seen
 Cavum:                 Absent                 Ductal Arch:            Previously seen
 Ventricles:            Abnormal, see          Diaphragm:              Appears normal
                        comments
 Choroid Plexus:        Abnormal, see          Stomach:                Appears normal, left
                        comments
                                                                       sided
 Cerebellum:            Abnormal, see          Abdomen:                Appears normal
                        comments
 Posterior Fossa:       Abnormal, see          Abdominal Wall:         Appears nml (cord
                        comments
                                                                       insert, abd wall)
 Face:                  Orbits and profile     Cord Vessels:           Appears normal (3
                        previously seen                                vessel cord)
 Lips:                  Appears normal         Kidneys:                Appear normal
 Thoracic:              Appears normal         Bladder:                Appears normal
 Heart:                 Previously seen        Spine:                  Appears normal
 RVOT:                  Previously seen        Upper Extremities:      Previously seen
 LVOT:                  Appears normal         Lower Extremities:      Previously seen

 Other:  Fetus appears to be a male. Heels visualized. Nasal bone visualized.
Cervix Uterus Adnexa

 Cervix
 Length:           4.63  cm.
 Normal appearance by transabdominal scan.

 Uterus
 No abnormality visualized.

 Left Ovary
 Not visualized.

 Right Ovary
 Within normal limits.

 Adnexa
 No abnormality visualized.
Impression

 Intracranial anomaly with severe ventriculomegaly.
 Patient had amniocentesis and the results are as follows:
 -AFAFP: Within normal limits.
 -FISH: Normal
 -Karyotype: 46,XY
 -Microarray: Normal.
 On 07/12/2018, the patient had fetal brain MRI. Important
 findings include "Lateral ventricular enlargement without third
 and fourth ventricular enlargement. Findings are suggestive
 of obstruction at the foramen of [place]." CSP appears
 normal. Posterior fossa could not be evaluated because of
 fetal motion.
 On today's ultrasound, amniotic fluid is normal and good fetal
 activity is seen. Fetal growth is appropriate for gestational
 age. Severe ventriculomegaly, measuring 2.1 centimeters, is
 seen. Posterior fossa appears abnormal with a small fused
 cerebellum. Rest of the anatomy appears normal.
 referred to Pediatric Neurosurgeon after evaluation at [facility].
 Place of delivery will be decided after consultations.
 I had communicated earlier with [facility]FM team.
Recommendations

 An appointment was made for her to return in 4 weeks for
 fetal growth assessment.
                 Apan, Asddas

## 2021-02-15 ENCOUNTER — Telehealth (HOSPITAL_COMMUNITY): Payer: Self-pay

## 2021-02-15 NOTE — Telephone Encounter (Signed)
Patient called requesting a refill on her Adderall 5mg . Patient also was told by her pharmacy that her insurance [facility]) is NOT covering the Wellbutrin SR 100mg . She's using [facility] on [address]/[place]

## 2021-02-18 NOTE — Telephone Encounter (Signed)
Apt made for refills 

## 2021-02-19 ENCOUNTER — Telehealth (HOSPITAL_COMMUNITY): Payer: Medicaid Other | Admitting: Psychiatry

## 2021-02-19 ENCOUNTER — Telehealth (HOSPITAL_COMMUNITY): Payer: Self-pay

## 2021-02-19 ENCOUNTER — Other Ambulatory Visit: Payer: Self-pay

## 2021-02-19 MED ORDER — AMPHETAMINE-DEXTROAMPHETAMINE 5 MG PO TABS: 5.0000 mg | ORAL_TABLET | Freq: Every day | ORAL | 0 refills | Status: DC

## 2021-02-19 NOTE — Telephone Encounter (Signed)
Patient needs a refill on her Adderall 5mg  to be sent to [facility] on [address] in [place]. Also, her insurance won't cover the Wellbutrin SR 100mg . Please review and advise.Thank you

## 2021-03-02 IMAGING — US US MFM OB FOLLOW UP
1 series · 13 of 28 positions shown · non-contrast
Comparison: none

[Series 1: us mfm ob follow up · 41 acquisitions, 13 frames shown]
[im 2/41]
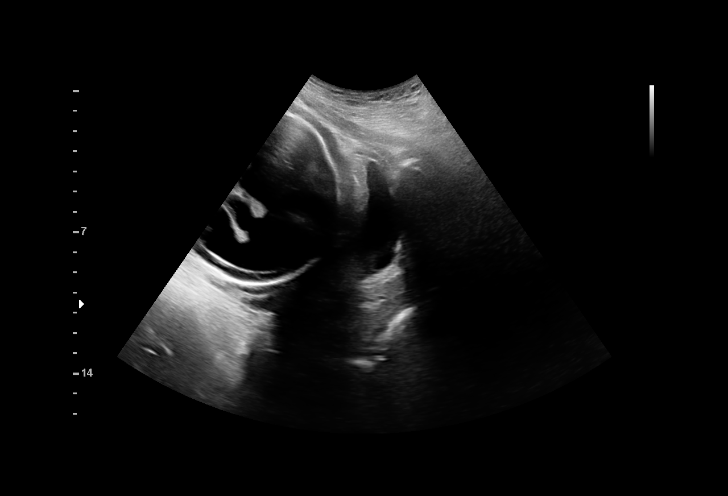
[im 5/41]
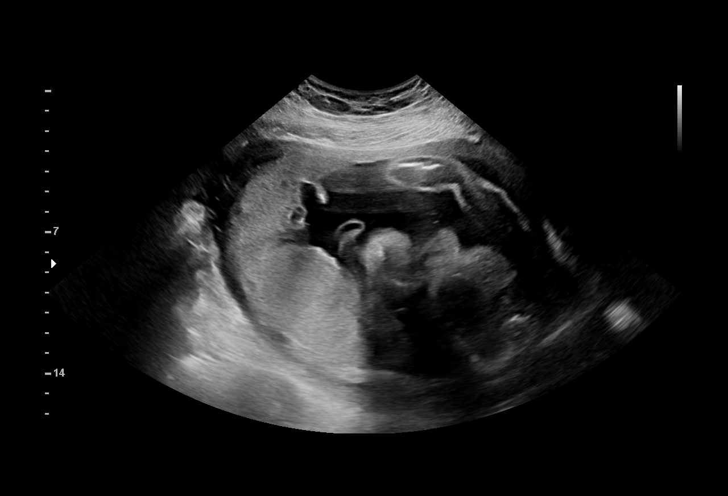
[im 8/41]
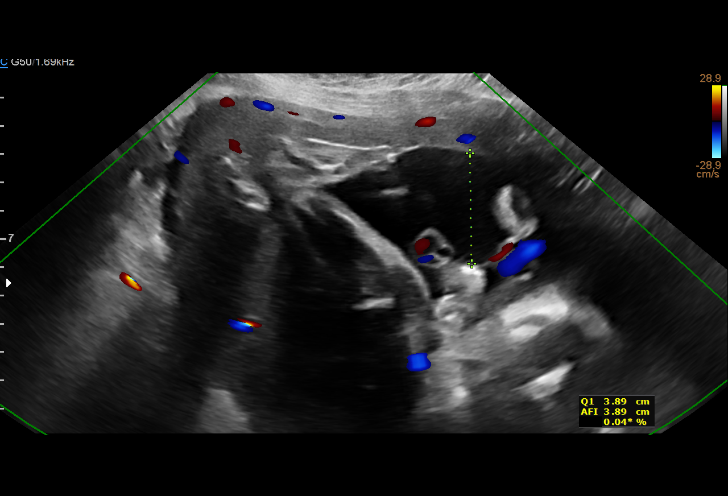
[im 11/41]
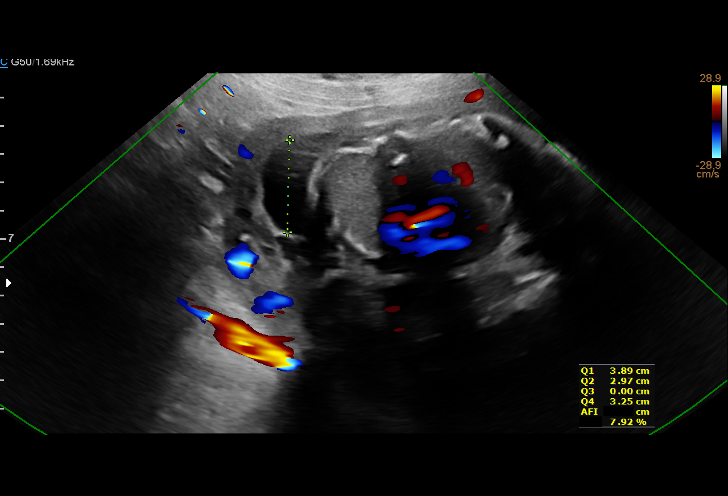
[im 14/41]
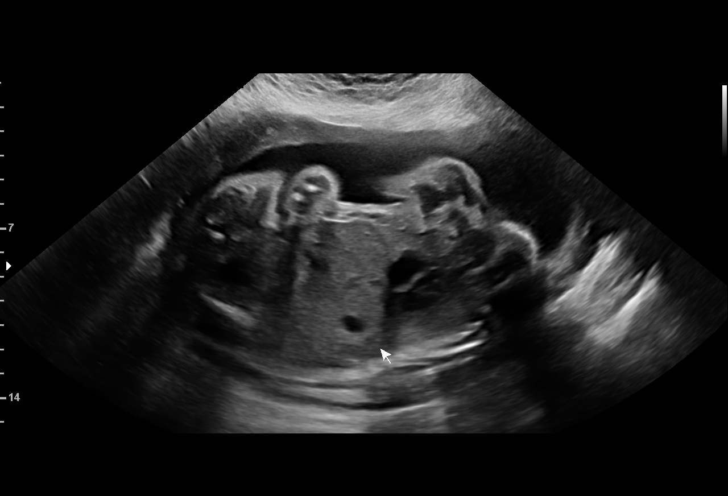
[im 17/41]
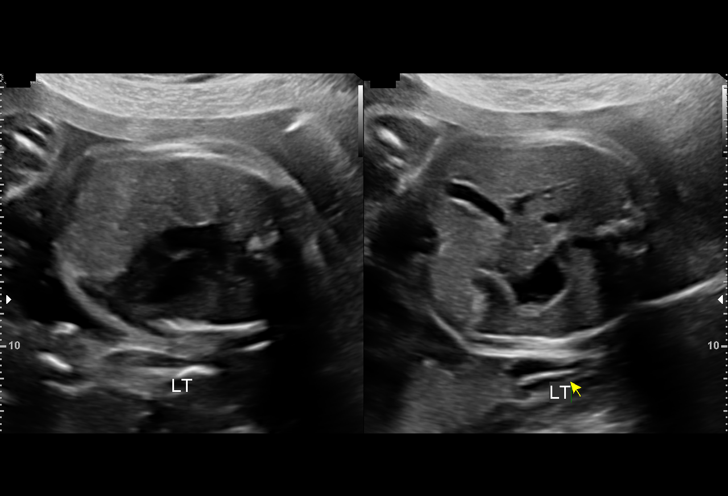
[im 21/41]
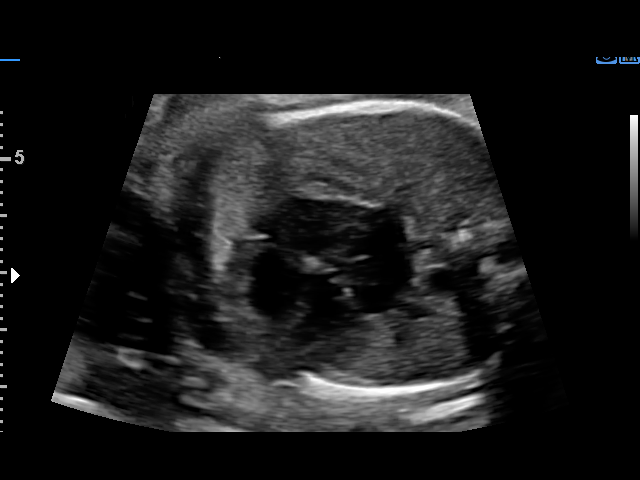
[im 24/41]
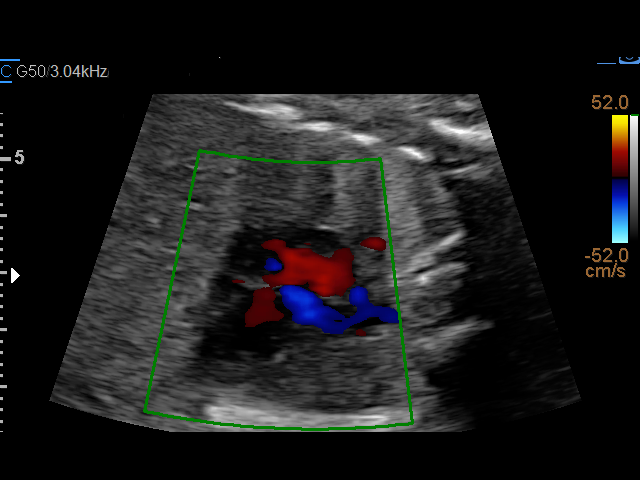
[im 27/41]
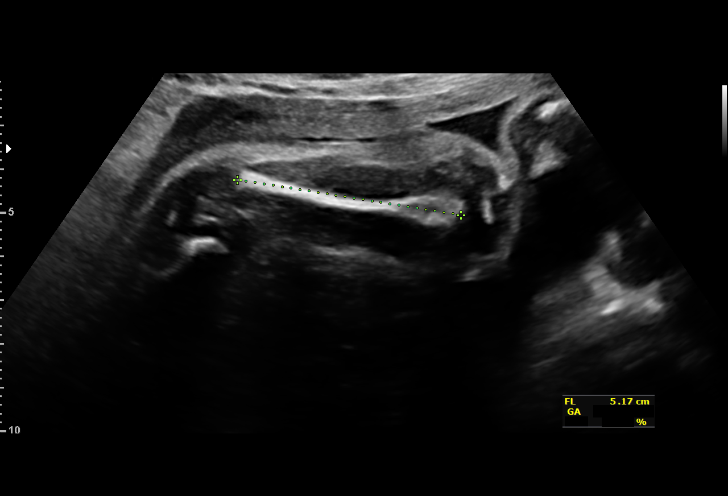
[im 30/41]
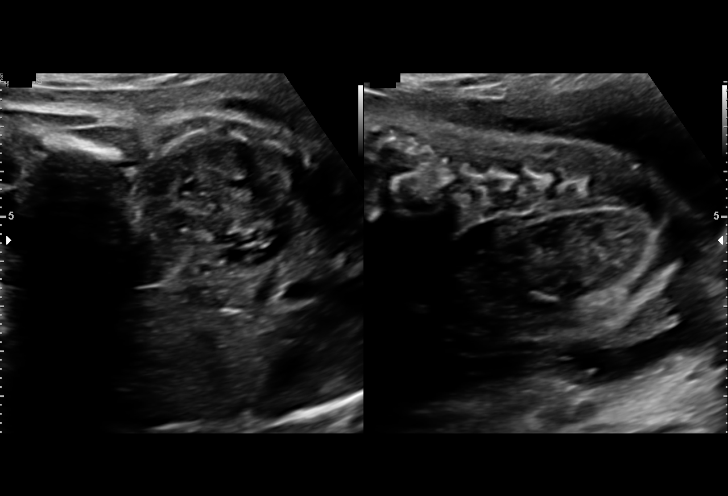
[im 33/41]
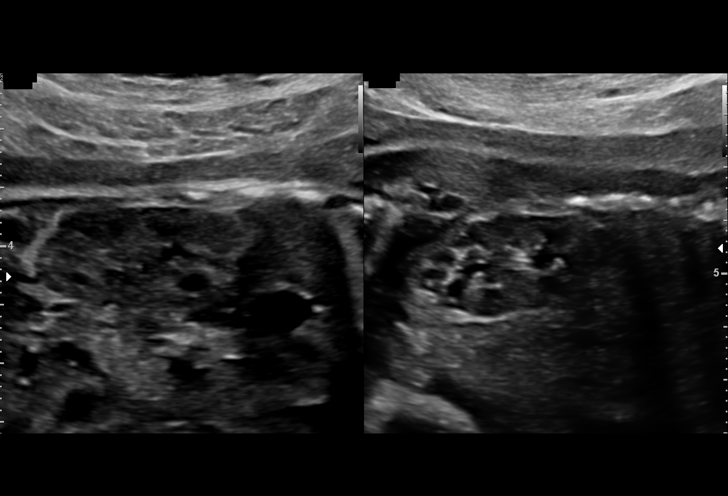
[im 36/41]
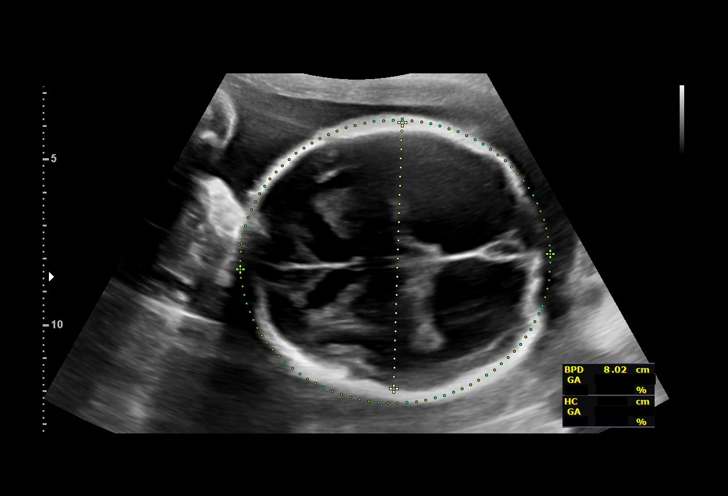
[im 39/41]
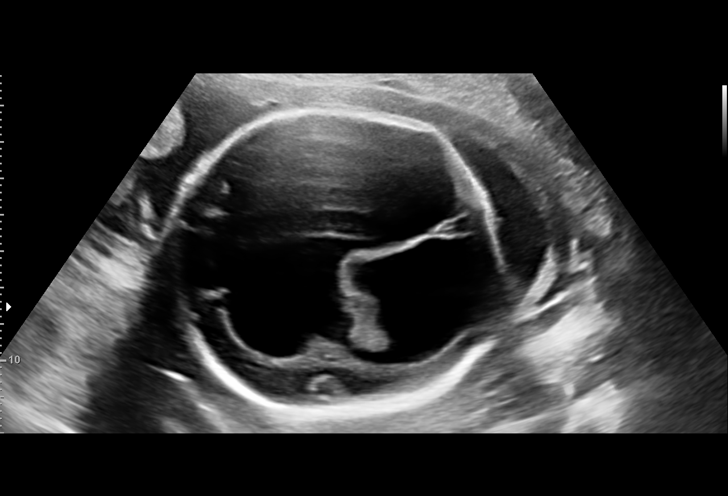

[13 of 28 positions shown; findings below may reference images not displayed]

----------------------------------------------------------------------

 ----------------------------------------------------------------------
Indications

  Fetal abnormality - other known or
  suspected (Amnio - 46XY, nml microarray)
  Encounter for other antenatal screening
  follow-up (low risk NIPs)
  28 weeks gestation of pregnancy
 ----------------------------------------------------------------------
Vital Signs

 BMI:
Fetal Evaluation

 Num Of Fetuses:         1
 Fetal Heart Rate(bpm):  137
 Cardiac Activity:       Observed
 Presentation:           Cephalic
 Placenta:               Posterior Fundal
 P. Cord Insertion:      Previously Visualized

 Amniotic Fluid
 AFI FV:      Within normal limits

 AFI Sum(cm)     %Tile       Largest Pocket(cm)
 10.11           11

 RUQ(cm)       RLQ(cm)       LUQ(cm)        LLQ(cm)
 3.89          3.25          2.97           0
Biometry
 BPD:      80.8  mm     G. Age:  32w 3d       > 99  %    CI:        85.57   %    70 - 86
                                                         FL/HC:      18.3   %    18.8 -
 HC:      275.1  mm     G. Age:  30w 0d         77  %    HC/AC:      1.16        1.05 -
 AC:      237.1  mm     G. Age:  28w 0d         39  %    FL/BPD:     62.4   %    71 - 87
 FL:       50.4  mm     G. Age:  27w 0d         11  %    FL/AC:      21.3   %    20 - 24
 HUM:      46.7  mm     G. Age:  27w 4d         32  %

 Est. FW:    9950  gm    2 lb 10 oz      51  %
OB History

 Gravidity:    3         Term:   1        Prem:   0        SAB:   1
 TOP:          0       Ectopic:  0        Living: 1
Gestational Age

 LMP:           28w 1d        Date:  02/12/18                 EDD:   11/19/18
 U/S Today:     29w 3d                                        EDD:   11/10/18
 Best:          28w 1d     Det. By:  LMP  (02/12/18)          EDD:   11/19/18
Anatomy

 Cranium:               Appears normal         Aortic Arch:            Previously seen
 Cavum:                 MRI confirmed          Ductal Arch:            Previously seen
                        present
 Ventricles:            Bilat.ventriculomeg    Diaphragm:              Appears normal
                        [place]
 Choroid Plexus:        Dangling Choroid       Stomach:                Appears normal, left
                                                                       sided
 Cerebellum:            Abnormal, see          Abdomen:                Appears normal
                        comments
 Posterior Fossa:       Abnormal, see          Abdominal Wall:         Previously seen
                        comments
 Nuchal Fold:           Not applicable (>20    Cord Vessels:           Previously seen
                        wks GA)
 Face:                  Orbits and profile     Kidneys:                Appear normal
                        previously seen
 Lips:                  Previously seen        Bladder:                Appears normal
 Thoracic:              Appears normal         Spine:                  Previously seen
 Heart:                 Appears normal         Upper Extremities:      Previously seen
                        (4CH, axis, and
                        situs)
 RVOT:                  Previously seen        Lower Extremities:      Previously seen
 LVOT:                  Appears normal

 Other:  Fetus appears to be a male. Heels visualized. Nasal bone visualized.
Cervix Uterus Adnexa

 Cervix
 Not visualized (advanced GA >24wks)
Impression

 Severe ventriculomegaly again seen today 3.1 cm prior
 cm with dangling choroids. She has seen Ruben Victor Laos
 with plan for delivery at [facility].

 Normal interval growth
 BPD is elevated today- we discuss increased likely need to
 cesarean delivery with BPD is >8 cm.
 I personally reviewed the plan of care and offered to speak
 with her significant other over the phone since he is not
 present for today's appointment

 Lastly, I spoke with Gargantua Malenfant the [facility] perinatal
 nurse coordinator and reviewed the plans for 34 week
 consultation at [facility] to discuss delivery planning and
 potential repeat MRI.
Recommendations

 Follow up growth in 4 weeks.

## 2021-04-13 ENCOUNTER — Telehealth: Payer: Self-pay | Admitting: *Deleted

## 2021-04-13 NOTE — Telephone Encounter (Signed)
Patient states that she is trying to get in contact with [facility] in the [facility] building, but she has not been able to get anyone on the phone. I took a note downstairs to [facility] after I was not able to reach them by phone either. I left the note on the front desk, no attendant at the desk to receive note.

## 2021-04-14 ENCOUNTER — Encounter (HOSPITAL_COMMUNITY): Payer: Self-pay | Admitting: Psychiatry

## 2021-04-14 ENCOUNTER — Telehealth (INDEPENDENT_AMBULATORY_CARE_PROVIDER_SITE_OTHER): Payer: Medicaid Other | Admitting: Psychiatry

## 2021-04-14 DIAGNOSIS — F331 Major depressive disorder, recurrent, moderate: Secondary | ICD-10-CM

## 2021-04-14 DIAGNOSIS — F900 Attention-deficit hyperactivity disorder, predominantly inattentive type: Secondary | ICD-10-CM

## 2021-04-14 DIAGNOSIS — Z639 Problem related to primary support group, unspecified: Secondary | ICD-10-CM

## 2021-04-14 DIAGNOSIS — F4310 Post-traumatic stress disorder, unspecified: Secondary | ICD-10-CM

## 2021-04-14 MED ORDER — AMPHETAMINE-DEXTROAMPHETAMINE 5 MG PO TABS: 5.0000 mg | ORAL_TABLET | Freq: Every day | ORAL | 0 refills | Status: DC

## 2021-04-14 MED ORDER — BUPROPION HCL ER (SR) 100 MG PO TB12: 100.0000 mg | ORAL_TABLET | Freq: Every day | ORAL | 0 refills | Status: DC
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
## 2021-05-11 ENCOUNTER — Other Ambulatory Visit (HOSPITAL_COMMUNITY): Payer: Self-pay | Admitting: Psychiatry

## 2021-06-01 ENCOUNTER — Encounter (HOSPITAL_COMMUNITY): Payer: Self-pay | Admitting: Psychiatry

## 2021-06-01 ENCOUNTER — Ambulatory Visit (HOSPITAL_COMMUNITY): Payer: Medicaid Other | Admitting: Psychiatry

## 2021-06-01 ENCOUNTER — Telehealth (INDEPENDENT_AMBULATORY_CARE_PROVIDER_SITE_OTHER): Payer: Medicaid Other | Admitting: Psychiatry

## 2021-06-01 DIAGNOSIS — F331 Major depressive disorder, recurrent, moderate: Secondary | ICD-10-CM

## 2021-06-01 DIAGNOSIS — F900 Attention-deficit hyperactivity disorder, predominantly inattentive type: Secondary | ICD-10-CM

## 2021-06-01 DIAGNOSIS — Z639 Problem related to primary support group, unspecified: Secondary | ICD-10-CM | POA: Diagnosis not present

## 2021-06-01 MED ORDER — AMPHETAMINE-DEXTROAMPHETAMINE 10 MG PO TABS: 10.0000 mg | ORAL_TABLET | Freq: Every day | ORAL | 0 refills | Status: DC

## 2021-06-01 MED ORDER — BUPROPION HCL ER (SR) 100 MG PO TB12: ORAL_TABLET | ORAL | 1 refills | Status: DC
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
## 2021-07-22 ENCOUNTER — Telehealth (HOSPITAL_COMMUNITY): Payer: Self-pay

## 2021-07-22 MED ORDER — AMPHETAMINE-DEXTROAMPHETAMINE 10 MG PO TABS: 10.0000 mg | ORAL_TABLET | Freq: Every day | ORAL | 0 refills | Status: DC

## 2021-07-22 NOTE — Telephone Encounter (Signed)
Patient needs a refill on Adderall sent to [facility] on [address] and [address] in [place] ?

## 2021-08-25 ENCOUNTER — Telehealth (HOSPITAL_COMMUNITY): Payer: Medicaid Other | Admitting: Psychiatry

## 2021-09-06 ENCOUNTER — Telehealth (HOSPITAL_COMMUNITY): Payer: Self-pay

## 2021-09-06 NOTE — Telephone Encounter (Signed)
Patient missed last visit due to son being hospitalized. She rescheduled but needs refills on her meds. [facility] on [address] and [address] in [place] ?

## 2021-09-07 MED ORDER — AMPHETAMINE-DEXTROAMPHETAMINE 10 MG PO TABS: 10.0000 mg | ORAL_TABLET | Freq: Every day | ORAL | 0 refills | Status: DC

## 2021-09-07 MED ORDER — BUPROPION HCL ER (SR) 100 MG PO TB12: ORAL_TABLET | ORAL | 0 refills | Status: DC

## 2021-09-09 ENCOUNTER — Telehealth (HOSPITAL_COMMUNITY): Payer: Medicaid Other | Admitting: Psychiatry

## 2021-11-12 ENCOUNTER — Telehealth (HOSPITAL_COMMUNITY): Payer: Medicaid Other | Admitting: Psychiatry

## 2021-11-15 ENCOUNTER — Other Ambulatory Visit (HOSPITAL_COMMUNITY): Payer: Self-pay | Admitting: Psychiatry

## 2021-11-15 ENCOUNTER — Telehealth (HOSPITAL_COMMUNITY): Payer: Self-pay | Admitting: Psychiatry

## 2021-11-15 MED ORDER — AMPHETAMINE-DEXTROAMPHETAMINE 10 MG PO TABS: 10.0000 mg | ORAL_TABLET | Freq: Every day | ORAL | 0 refills | Status: DC

## 2021-11-15 MED ORDER — BUPROPION HCL ER (SR) 100 MG PO TB12: ORAL_TABLET | ORAL | 0 refills | Status: DC

## 2021-11-15 NOTE — Telephone Encounter (Signed)
Pt calling. She missed her appt on Friday due to computer issues.  She rschd for an in person appt on 8/3 with [facility].  She will need a refill as she is out of medication.  She needs refill on Adderall and Wellbutrin.  Can we please a 1x refill to  [facility] [address]  Next Visit -8/3 Last Visit - 1/31  Sending to covering provider

## 2021-11-15 NOTE — Telephone Encounter (Signed)
sent 

## 2021-12-02 ENCOUNTER — Ambulatory Visit (HOSPITAL_COMMUNITY): Payer: Medicaid Other | Admitting: Psychiatry

## 2021-12-09 ENCOUNTER — Ambulatory Visit (INDEPENDENT_AMBULATORY_CARE_PROVIDER_SITE_OTHER): Payer: Medicaid Other | Admitting: Psychiatry

## 2021-12-09 ENCOUNTER — Encounter (HOSPITAL_COMMUNITY): Payer: Self-pay | Admitting: Psychiatry

## 2021-12-09 DIAGNOSIS — F331 Major depressive disorder, recurrent, moderate: Secondary | ICD-10-CM | POA: Diagnosis not present

## 2021-12-09 DIAGNOSIS — Z639 Problem related to primary support group, unspecified: Secondary | ICD-10-CM

## 2021-12-09 DIAGNOSIS — F900 Attention-deficit hyperactivity disorder, predominantly inattentive type: Secondary | ICD-10-CM | POA: Diagnosis not present

## 2021-12-09 MED ORDER — BUPROPION HCL ER (SR) 150 MG PO TB12: ORAL_TABLET | ORAL | 1 refills | Status: DC

## 2021-12-09 MED ORDER — AMPHETAMINE-DEXTROAMPHETAMINE 10 MG PO TABS: 10.0000 mg | ORAL_TABLET | Freq: Every day | ORAL | 0 refills | Status: DC
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
## 2021-12-12 ENCOUNTER — Other Ambulatory Visit (HOSPITAL_COMMUNITY): Payer: Self-pay | Admitting: Psychiatry

## 2022-02-03 ENCOUNTER — Telehealth (HOSPITAL_COMMUNITY): Payer: Self-pay

## 2022-02-03 MED ORDER — AMPHETAMINE-DEXTROAMPHETAMINE 10 MG PO TABS: 10.0000 mg | ORAL_TABLET | Freq: Every day | ORAL | 0 refills | Status: DC

## 2022-02-03 MED ORDER — BUPROPION HCL ER (SR) 150 MG PO TB12: ORAL_TABLET | ORAL | 1 refills | Status: DC

## 2022-02-03 NOTE — Telephone Encounter (Signed)
Patient needs a refill on Adderall and Wellbutrin sent to [facility] on [address]. Last refill 08/10 Next ov 10/10

## 2022-02-03 NOTE — Telephone Encounter (Signed)
Patient informed. 

## 2022-02-08 ENCOUNTER — Telehealth (HOSPITAL_COMMUNITY): Payer: Medicaid Other | Admitting: Psychiatry

## 2022-02-08 ENCOUNTER — Encounter (HOSPITAL_COMMUNITY): Payer: Self-pay

## 2022-03-08 ENCOUNTER — Telehealth (HOSPITAL_COMMUNITY): Payer: Self-pay

## 2022-03-08 MED ORDER — AMPHETAMINE-DEXTROAMPHETAMINE 10 MG PO TABS: 10.0000 mg | ORAL_TABLET | Freq: Every day | ORAL | 0 refills | Status: DC

## 2022-03-08 NOTE — Telephone Encounter (Signed)
Medication refill - Called patient back, after she left a message stating she is in need of a new Adderall 10 mg order to be sent into her [facility] on the [address] in [place], last provided 02/03/22.

## 2022-03-08 NOTE — Telephone Encounter (Signed)
Medication management - Telephone call with patient to inform Dr. De Nurse had sent in her requested new Adderall 10 mg order to her [facility] and patient to call back if any issues filling when due.

## 2022-04-05 ENCOUNTER — Telehealth (HOSPITAL_COMMUNITY): Payer: Self-pay

## 2022-04-05 NOTE — Telephone Encounter (Signed)
Medication refils - Patient left a message she is in need of a new Adderall order, last provided 03/08/22 and a new Bupropion order, last provided 02/03/22 + 1 refill. Patient returns next on 04/15/22.

## 2022-04-06 MED ORDER — BUPROPION HCL ER (SR) 150 MG PO TB12: ORAL_TABLET | ORAL | 1 refills | Status: DC

## 2022-04-06 MED ORDER — AMPHETAMINE-DEXTROAMPHETAMINE 10 MG PO TABS: 10.0000 mg | ORAL_TABLET | Freq: Every day | ORAL | 0 refills | Status: DC

## 2022-04-13 ENCOUNTER — Telehealth (HOSPITAL_COMMUNITY): Payer: Self-pay | Admitting: Psychiatry

## 2022-04-13 NOTE — Telephone Encounter (Signed)
Patient called stating that "all [facility] pharmacies are out of stock for  amphetamine-dextroamphetamine (ADDERALL) 10 MG tablet. States pharmacy recommended change to amphetamine-dextroamphetamine (ADDERALL) 20 MG tablet and cut tablets in half.   Requests order for: amphetamine-dextroamphetamine (ADDERALL) 20 MG tablet be sent to:    [id number] - [place], [place] - [address] AT [facility] OF [address] (Ph: [phone])   Last ordered: 04/06/2022 30 tablets (10 mg)  Last visit: 12/09/2021 Next visit: 04/15/2022

## 2022-04-14 MED ORDER — AMPHETAMINE-DEXTROAMPHETAMINE 20 MG PO TABS: 10.0000 mg | ORAL_TABLET | Freq: Every day | ORAL | 0 refills | Status: DC

## 2022-04-14 NOTE — Telephone Encounter (Signed)
Medication management - Telephone message left for patient that Dr. Gilmore Laroche had sent in her Adderall 20 mg order, taking 0.5 tablets daily to her [facility] on the corner of [address] and [address] and to call back if any questions.

## 2022-04-15 ENCOUNTER — Telehealth (INDEPENDENT_AMBULATORY_CARE_PROVIDER_SITE_OTHER): Payer: Medicaid Other | Admitting: Psychiatry

## 2022-04-15 ENCOUNTER — Encounter (HOSPITAL_COMMUNITY): Payer: Self-pay | Admitting: Psychiatry

## 2022-04-15 DIAGNOSIS — Z639 Problem related to primary support group, unspecified: Secondary | ICD-10-CM

## 2022-04-15 DIAGNOSIS — F331 Major depressive disorder, recurrent, moderate: Secondary | ICD-10-CM | POA: Diagnosis not present

## 2022-04-15 DIAGNOSIS — F4310 Post-traumatic stress disorder, unspecified: Secondary | ICD-10-CM | POA: Diagnosis not present

## 2022-04-15 DIAGNOSIS — F900 Attention-deficit hyperactivity disorder, predominantly inattentive type: Secondary | ICD-10-CM | POA: Diagnosis not present
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
## 2022-05-16 ENCOUNTER — Telehealth (HOSPITAL_COMMUNITY): Payer: Self-pay

## 2022-05-16 MED ORDER — AMPHETAMINE-DEXTROAMPHETAMINE 20 MG PO TABS: 10.0000 mg | ORAL_TABLET | Freq: Every day | ORAL | 0 refills | Status: DC

## 2022-05-16 NOTE — Telephone Encounter (Signed)
Patient needs a refill on Adderall sent to [facility] on [address] in [place] Last refill 12/14 Next ov 02/08

## 2022-06-09 ENCOUNTER — Encounter (HOSPITAL_COMMUNITY): Payer: Self-pay

## 2022-06-09 ENCOUNTER — Telehealth (HOSPITAL_COMMUNITY): Payer: Medicaid Other | Admitting: Psychiatry

## 2022-06-13 ENCOUNTER — Telehealth (HOSPITAL_COMMUNITY): Payer: Self-pay | Admitting: *Deleted

## 2022-06-13 ENCOUNTER — Encounter (HOSPITAL_COMMUNITY): Payer: Self-pay

## 2022-06-13 ENCOUNTER — Telehealth (HOSPITAL_COMMUNITY): Payer: Medicaid Other | Admitting: Psychiatry

## 2022-06-13 MED ORDER — BUPROPION HCL ER (SR) 150 MG PO TB12: ORAL_TABLET | ORAL | 0 refills | Status: DC

## 2022-06-13 NOTE — Telephone Encounter (Signed)
[place] [place] - [address] AT [place] Refill Request  buPROPion [facility] SR) 150 MG 12 hr tablet   Next appt  06/13/22 Last appt   04/15/22

## 2022-06-13 NOTE — Addendum Note (Signed)
Addended by: Merian Capron on: 06/13/2022 04:47 PM   Modules accepted: Orders

## 2022-06-14 ENCOUNTER — Encounter (HOSPITAL_COMMUNITY): Payer: Self-pay | Admitting: Psychiatry

## 2022-06-20 ENCOUNTER — Telehealth (HOSPITAL_COMMUNITY): Payer: Self-pay | Admitting: *Deleted

## 2022-06-20 MED ORDER — AMPHETAMINE-DEXTROAMPHETAMINE 20 MG PO TABS: 10.0000 mg | ORAL_TABLET | Freq: Every day | ORAL | 0 refills | Status: DC

## 2022-06-20 NOTE — Telephone Encounter (Signed)
Patient Requested Refill -- amphetamine-dextroamphetamine (ADDERALL) 20 MG tablet   Patient has been Dismissed will require the 30-Day supply

## 2022-06-20 NOTE — Addendum Note (Signed)
Addended by: Merian Capron on: 06/20/2022 09:59 AM   Modules accepted: Orders

## 2022-08-08 ENCOUNTER — Telehealth (HOSPITAL_COMMUNITY): Payer: Self-pay

## 2022-08-08 NOTE — Telephone Encounter (Signed)
Medication refill - Message left for patient, after she left one requesting Dr. Gilmore Laroche still provide her with refills of medications as she stated she cannot get in with another provider for 2 months. Informed on message this request would be sent to Dr. Gilmore Laroche but not sure this would be granted or continued as he had sent in a new Adderall order on 06/20/22, after patient's last missed appointment and a new Bupropion 150 mg order on 06/13/22.  Requested patient call back if any questions.

## 2022-08-09 NOTE — Telephone Encounter (Signed)
Medication management - Patient called back and discussed with her that she would need to follow up with a primary care provider or another provider for any further prescriptions. Patient stated she did not have a PCP but would call her insurance to locate some one in the area.  Informed pt if anything became a crisis while waiting she could always visit the [facility] if needed and patient stated understanding.

## 2022-10-07 ENCOUNTER — Ambulatory Visit (HOSPITAL_COMMUNITY)
Admission: EM | Admit: 2022-10-07 | Discharge: 2022-10-07 | Disposition: A | Discharge status: Home / Self Care | Payer: Medicaid Other | Attending: Psychiatry | Admitting: Psychiatry

## 2022-10-07 DIAGNOSIS — F331 Major depressive disorder, recurrent, moderate: Secondary | ICD-10-CM | POA: Insufficient documentation

## 2022-10-07 DIAGNOSIS — F908 Attention-deficit hyperactivity disorder, other type: Secondary | ICD-10-CM | POA: Insufficient documentation

## 2022-10-07 NOTE — Progress Notes (Signed)
   10/07/22 1122  [facility] Triage Screening (Walk-ins at [facility] only)  How Did You Hear About [place]? Self  What Is the Reason for Your Visit/Call Today? Pt presents to [facility] seeking medication managment. Pt reports she was discharged from [facility] behavioral outpatient in [place] in March due to missing 3 appointments. Pt states she has been without medication since April and would like to restart services and refill her medication. Pt reports increased anxiety, depression symptoms, anger outbursts. Pt reports difficulty focusing. Pt states she went upstairs to the [facility] outpatient today and made an appointment for July 2nd. Pt denies SI/HI and AVH.  How Long Has This Been Causing You Problems? <Week  Have You Recently Had Any Thoughts About Hurting Yourself? No  Are You Planning to Commit Suicide/Harm Yourself At This time? No  Have you Recently Had Thoughts About Hurting Someone Karolee Ohs? No  Are You Planning To Harm Someone At This Time? No  Are you currently experiencing any auditory, visual or other hallucinations? No  Have You Used Any Alcohol or Drugs in the Past 24 Hours? No  Do you have any current medical co-morbidities that require immediate attention? No  Clinician description of patient physical appearance/behavior: calm ,cooperative,well groomed  What Do You Feel Would Help You the Most Today? Medication(s)  If access to [facility] was not available, would you have sought care in the Emergency Department? No  Determination of Need Routine (7 days)  Options For Referral Outpatient Therapy;Medication Management

## 2022-10-07 NOTE — Discharge Instructions (Signed)

## 2022-10-07 NOTE — ED Provider Notes (Signed)
Behavioral Health Urgent Care Medical Screening Exam  Patient Name: Teresa Reed MRN: [id number] Date of Evaluation: 10/07/22 Chief Complaint:   Diagnosis:  Final diagnoses:  Moderate episode of recurrent major depressive disorder (HCC)  Attention deficit hyperactivity disorder (ADHD), other type    History of Present illness: Teresa Reed is a 31 y.o. female.   Patient presents voluntarily reporting that she needs to get back on medications. She reports a hx of ADHD and MDD, was seeing a provider in outpatient services but was discharged  after missing many appointments. She reports that she stopped taking medications in April and wanted to manage her symptoms naturally. However, she has been experiencing symptoms including agitations, mood swings, impatience, decreased concentration, irritability and restlessness. She has been feeling depressed and her symptoms are affecting her family and her job. Patient is self-employed, has just opened a new business and she is going trough stress related to staffing and other work-related issues. She reports that she has been yelling at her children due to increased stress an her son has been telling her that she may need help. Patient reports that she lives with her 3 children and one of them has special needs. The father of the children is not helping and her family is not around.  Patient admits that she was supposed to be taking Adderall and Wellbutrin but decided to stop taking and was not seeing her provider on regular basis.   Assessments: Teresa Reed is a 32 year-old female who is receptive and cooperative upon approach. She is appropriately dressed and groomed. She is alert and oriented x 4. She denies SI/HI/AVH. Her thought process is clear and goal directed. She does not appear to be preoccupied or responding to internal stimuli. Remains focussed throughout this assessment. She admits to feeling anxious and depressed. She  reports feeling stressed by her current situation: work, owning a business, child care and not having support. Patient denies pain. She denies generalized weakness. Denies headache or dizziness. Denies hx of seizures or syncope. Denies vision or hearing difficulties. Denies neck/chest/back or abdominal pain. Denies N/V. Denies current medical concerns. Vital signs WNL.  She denies substance use. She is currently self-employed as Airline pilot and reports that this job can be stressful.  She admits that she is experiencing difficulty  handling the stress  related to her current situation and looking into getting back on medications.   I coordinated with [facility] outpatient services and it was confirmed that she has an appointment on 11/01/22. Patient was also encouraged to return on Monday as a walking for medication management. She reported that she can not find the time to come in that early and will wait for her July appointment. She denied SI/HI/AVH upon discharge.   Flowsheet Row ED from 10/07/2022 in [facility] Video Visit from 04/15/2022 in [facility] at [facility] Office Visit from 12/09/2021 in [facility] at [facility]  C-SSRS RISK CATEGORY No Risk No Risk No Risk       Psychiatric Specialty Exam  Presentation  General Appearance:Appropriate for Environment  Eye Contact:Good  Speech:Clear and Coherent  Speech Volume:Normal  Handedness:Right   Mood and Affect  Mood: Anxious; Depressed  Affect:No data recorded  Thought Process  Thought Processes: Coherent  Descriptions of Associations:Intact  Orientation:Full (Time, Place and Person)  Thought Content:Logical    Hallucinations:None  Ideas of Reference:None  Suicidal Thoughts:No  Homicidal Thoughts:No   Sensorium  Memory: Immediate Good; Recent Good; Remote  Good  Judgment: Fair  Insight: Fair   Chartered certified accountant: Fair  Attention Span: Fair  Recall: Good  Fund of Knowledge: Good  Language: Good   Psychomotor Activity  Psychomotor Activity: Normal   Assets  Assets: Communication Skills; Desire for Improvement; Financial Resources/Insurance; Housing; Resilience; Physical Health; Vocational/Educational   Sleep  Sleep: Fair  Number of hours:  4   Physical Exam: Physical Exam Vitals and nursing note reviewed.  Constitutional:      Appearance: Normal appearance.  HENT:     Head: Normocephalic and atraumatic.     Right Ear: Tympanic membrane normal.     Left Ear: Tympanic membrane normal.     Nose: Nose normal.  Eyes:     Extraocular Movements: Extraocular movements intact.     Pupils: Pupils are equal, round, and reactive to light.  Cardiovascular:     Rate and Rhythm: Normal rate.     Pulses: Normal pulses.  Pulmonary:     Effort: Pulmonary effort is normal.  Musculoskeletal:        General: Normal range of motion.     Cervical back: Normal range of motion.  Skin:    General: Skin is warm and dry.  Neurological:     General: No focal deficit present.     Mental Status: She is alert and oriented to person, place, and time.  Psychiatric:        Thought Content: Thought content normal.    Review of Systems  Constitutional: Negative.   HENT: Negative.    Eyes: Negative.   Respiratory: Negative.    Cardiovascular: Negative.   Gastrointestinal: Negative.   Genitourinary: Negative.   Musculoskeletal: Negative.   Skin: Negative.   Neurological: Negative.   Endo/Heme/Allergies: Negative.   Psychiatric/Behavioral:  Positive for depression. The patient is nervous/anxious.    Blood pressure 112/60, pulse 66, temperature 98 F (36.7 C), temperature source Oral, resp. rate 18, SpO2 100 %, currently breastfeeding. There is no height or weight on file to calculate  BMI.  Musculoskeletal: Strength & Muscle Tone: within normal limits Gait & Station: normal Patient leans: Right   [facility] MSE Discharge Disposition for Follow up and Recommendations: Based on my evaluation the patient does not appear to have an emergency medical condition and can be discharged with resources and follow up care in outpatient services for Medication Management, Individual Therapy, and Group Therapy   Olin Pia, NP 10/07/2022, 12:50 PM

## 2022-11-01 ENCOUNTER — Ambulatory Visit (INDEPENDENT_AMBULATORY_CARE_PROVIDER_SITE_OTHER): Payer: Medicaid Other | Admitting: Physician Assistant

## 2022-11-01 ENCOUNTER — Encounter (HOSPITAL_COMMUNITY): Payer: Self-pay | Admitting: Physician Assistant

## 2022-11-01 VITALS — BP 113/72 | HR 64 | Temp 98.1°F | Ht 63.0 in | Wt 168.0 lb

## 2022-11-01 DIAGNOSIS — R41840 Attention and concentration deficit: Secondary | ICD-10-CM | POA: Insufficient documentation

## 2022-11-01 DIAGNOSIS — F4310 Post-traumatic stress disorder, unspecified: Secondary | ICD-10-CM

## 2022-11-01 DIAGNOSIS — F900 Attention-deficit hyperactivity disorder, predominantly inattentive type: Secondary | ICD-10-CM | POA: Diagnosis not present

## 2022-11-01 DIAGNOSIS — F331 Major depressive disorder, recurrent, moderate: Secondary | ICD-10-CM

## 2022-11-01 MED ORDER — AMPHETAMINE-DEXTROAMPHETAMINE 20 MG PO TABS: 10.0000 mg | ORAL_TABLET | Freq: Every day | ORAL | 0 refills | Status: DC

## 2022-11-01 MED ORDER — BUPROPION HCL ER (SR) 150 MG PO TB12: ORAL_TABLET | ORAL | 2 refills | Status: DC
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
## 2022-12-01 ENCOUNTER — Telehealth (HOSPITAL_COMMUNITY): Payer: Self-pay | Admitting: Physician Assistant

## 2022-12-06 ENCOUNTER — Telehealth (HOSPITAL_COMMUNITY): Payer: Self-pay | Admitting: Psychiatry

## 2022-12-07 ENCOUNTER — Other Ambulatory Visit (HOSPITAL_COMMUNITY): Payer: Self-pay | Admitting: Psychiatry

## 2022-12-07 DIAGNOSIS — F900 Attention-deficit hyperactivity disorder, predominantly inattentive type: Secondary | ICD-10-CM

## 2022-12-07 MED ORDER — AMPHETAMINE-DEXTROAMPHETAMINE 20 MG PO TABS: 10.0000 mg | ORAL_TABLET | Freq: Every day | ORAL | 0 refills | Status: DC

## 2022-12-07 NOTE — Telephone Encounter (Signed)
No message entered

## 2022-12-07 NOTE — Telephone Encounter (Signed)
Medication sent to preferred pharmacy

## 2022-12-16 NOTE — Telephone Encounter (Signed)
Thank you :)

## 2022-12-20 ENCOUNTER — Encounter (HOSPITAL_COMMUNITY): Payer: Medicaid Other | Admitting: Physician Assistant

## 2023-01-06 ENCOUNTER — Ambulatory Visit (INDEPENDENT_AMBULATORY_CARE_PROVIDER_SITE_OTHER): Payer: Medicaid Other | Admitting: Physician Assistant

## 2023-01-06 ENCOUNTER — Encounter (HOSPITAL_COMMUNITY): Payer: Self-pay | Admitting: Physician Assistant

## 2023-01-06 DIAGNOSIS — F331 Major depressive disorder, recurrent, moderate: Secondary | ICD-10-CM | POA: Diagnosis not present

## 2023-01-06 DIAGNOSIS — F900 Attention-deficit hyperactivity disorder, predominantly inattentive type: Secondary | ICD-10-CM

## 2023-01-06 MED ORDER — BUPROPION HCL ER (XL) 150 MG PO TB24: ORAL_TABLET | ORAL | 1 refills | Status: DC

## 2023-01-06 MED ORDER — AMPHETAMINE-DEXTROAMPHETAMINE 15 MG PO TABS: 15.0000 mg | ORAL_TABLET | Freq: Every day | ORAL | 0 refills | Status: DC
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
## 2023-02-17 ENCOUNTER — Encounter (HOSPITAL_COMMUNITY): Payer: Medicaid Other | Admitting: Physician Assistant

## 2023-03-02 ENCOUNTER — Encounter (HOSPITAL_COMMUNITY): Payer: Self-pay

## 2023-03-07 ENCOUNTER — Other Ambulatory Visit (HOSPITAL_COMMUNITY): Payer: Self-pay | Admitting: Physician Assistant

## 2023-03-07 ENCOUNTER — Telehealth (HOSPITAL_COMMUNITY): Payer: Self-pay

## 2023-03-07 ENCOUNTER — Ambulatory Visit (HOSPITAL_COMMUNITY): Payer: Medicaid Other | Admitting: Mental Health

## 2023-03-07 ENCOUNTER — Telehealth (HOSPITAL_COMMUNITY): Payer: Self-pay | Admitting: Physician Assistant

## 2023-03-07 DIAGNOSIS — F331 Major depressive disorder, recurrent, moderate: Secondary | ICD-10-CM

## 2023-03-07 DIAGNOSIS — F900 Attention-deficit hyperactivity disorder, predominantly inattentive type: Secondary | ICD-10-CM

## 2023-03-07 MED ORDER — AMPHETAMINE-DEXTROAMPHETAMINE 15 MG PO TABS: 15.0000 mg | ORAL_TABLET | Freq: Every day | ORAL | 0 refills | Status: DC

## 2023-03-07 MED ORDER — BUPROPION HCL ER (XL) 300 MG PO TB24: 300.0000 mg | ORAL_TABLET | Freq: Every day | ORAL | 0 refills | Status: DC

## 2023-03-07 NOTE — Telephone Encounter (Signed)
Message acknowledged and reviewed. Patient's medication to be e-prescribed to pharmacy of choice.

## 2023-03-07 NOTE — Progress Notes (Signed)
Provider was contacted by [place] and was informed to bridge patient's medication until her next encounter scheduled with Dr. Josephina Shih.  Patient's next appointment is scheduled for 04/05/2023.  Patient's medication to be e-prescribed to pharmacy of choice.

## 2023-03-07 NOTE — Telephone Encounter (Signed)
PT called this morning wanting to know when her upcoming appointment was . I informed PT that the New Patient Therapy appt has been cancelled due to non confirmation, got PT rescheduled for therapy and also gave PT the date of her New Patient appt with Dr Josephina Shih. Pt said well I am out of medications, I informed PT that I could send her provider a message to bridge the medications but due to one of them being a narcotic that it is under the Providers digression, PT stated that they have always done that before for her and she doesn't understand what the problem is, I informed PT of her last no show date - she said she never no showed. She called the day off to cancel appt so that Is why It was a no show and that was explained to her. Pt became angry stating she needs her meds and has never had a issue. I informed PT that I sent a message to her provider - PT stated ok phone call ended. PT called back requesting to know my name, I gave the PT my name.

## 2023-04-03 ENCOUNTER — Ambulatory Visit (HOSPITAL_COMMUNITY): Payer: Medicaid Other | Admitting: Psychiatry

## 2023-04-03 ENCOUNTER — Encounter (HOSPITAL_COMMUNITY): Payer: Self-pay

## 2023-04-04 ENCOUNTER — Ambulatory Visit (HOSPITAL_COMMUNITY)
Admission: EM | Admit: 2023-04-04 | Discharge: 2023-04-04 | Disposition: A | Discharge status: Home / Self Care | Payer: Medicaid Other | Attending: Family Medicine | Admitting: Family Medicine

## 2023-04-04 ENCOUNTER — Encounter (HOSPITAL_COMMUNITY): Payer: Self-pay

## 2023-04-04 ENCOUNTER — Ambulatory Visit (INDEPENDENT_AMBULATORY_CARE_PROVIDER_SITE_OTHER): Payer: Medicaid Other

## 2023-04-04 DIAGNOSIS — J189 Pneumonia, unspecified organism: Secondary | ICD-10-CM

## 2023-04-04 DIAGNOSIS — J4521 Mild intermittent asthma with (acute) exacerbation: Secondary | ICD-10-CM

## 2023-04-04 DIAGNOSIS — R053 Chronic cough: Secondary | ICD-10-CM

## 2023-04-04 DIAGNOSIS — J069 Acute upper respiratory infection, unspecified: Secondary | ICD-10-CM | POA: Diagnosis not present

## 2023-04-04 MED ORDER — ALBUTEROL SULFATE HFA 108 (90 BASE) MCG/ACT IN AERS: 2.0000 | INHALATION_SPRAY | RESPIRATORY_TRACT | 0 refills | Status: AC | PRN

## 2023-04-04 MED ORDER — MONTELUKAST SODIUM 10 MG PO TABS: 10.0000 mg | ORAL_TABLET | Freq: Every day | ORAL | 0 refills | Status: AC

## 2023-04-04 MED ORDER — PREDNISONE 20 MG PO TABS: 40.0000 mg | ORAL_TABLET | Freq: Every day | ORAL | 0 refills | Status: AC

## 2023-04-04 MED ORDER — DOXYCYCLINE HYCLATE 100 MG PO CAPS: 100.0000 mg | ORAL_CAPSULE | Freq: Two times a day (BID) | ORAL | 0 refills | Status: AC

## 2023-04-04 MED ORDER — BENZONATATE 100 MG PO CAPS: 100.0000 mg | ORAL_CAPSULE | Freq: Three times a day (TID) | ORAL | 0 refills | Status: AC | PRN

## 2023-04-04 NOTE — ED Triage Notes (Signed)
Pt c/o dry cough x20months. States taking OTC with no relief.

## 2023-04-04 NOTE — ED Provider Notes (Signed)
[facility]    CSN: [id number] Arrival date & time: 04/04/23  1821      History   Chief Complaint Chief Complaint  Patient presents with   Cough    HPI Teresa Reed is a 31 y.o. female.    Cough Here for dry cough it has been going on for about 3 months.  She did not otherwise had any other symptoms until 2 days ago when she had some subjective fever and has felt congested in her chest and may be some pleuritic chest pain.  No sore throat and no nasal drainage.  Home COVID test was negative in the last couple days.  No nausea or vomiting or diarrhea.  She does have a remote history of trouble with asthma.  Her last menstrual cycle was about 1 week ago  Past Medical History:  Diagnosis Date   BV (bacterial vaginosis)    History of ectopic pregnancy 04/17/2018   G2, 2009   Seizures (HCC)    UTI (lower urinary tract infection)     Patient Active Problem List   Diagnosis Date Noted   Attention deficit hyperactivity disorder (ADHD), predominantly inattentive type 11/01/2022   MDD (major depressive disorder), recurrent episode, moderate (HCC) 11/01/2022   PTSD (post-traumatic stress disorder) 11/01/2022   Fibromyalgia 10/11/2018   History of seizure 03/28/2018   GAD (generalized anxiety disorder) 11/26/2014    Past Surgical History:  Procedure Laterality Date   CESAREAN SECTION     CESAREAN SECTION WITH BILATERAL TUBAL LIGATION Bilateral 10/25/2019   Procedure: CESAREAN SECTION WITH BILATERAL TUBAL LIGATION;  Surgeon: Tereso Newcomer, MD;  Location: [facility] LD ORS;  Service: Obstetrics;  Laterality: Bilateral;   DILATION AND CURETTAGE, DIAGNOSTIC / THERAPEUTIC     TUMOR REMOVAL     tumor removed       OB History     Gravida  4   Para  3   Term  2   Preterm  1   AB  1   Living  3      SAB  1   IAB  0   Ectopic  0   Multiple      Live Births  3        Obstetric Comments  LOW VERTICAL c-section WITH EXTENSION INTO ACTIVE  SEGMENT at [redacted]w[redacted]d for non-reassuring fetal status in the setting of marked hydrocephalus necessitating cesarean delivery on 10/30/2018; basically classical cesarean section          Home Medications    Prior to Admission medications   Medication Sig Start Date End Date Taking? Authorizing Provider  albuterol (VENTOLIN HFA) 108 (90 Base) MCG/ACT inhaler Inhale 2 puffs into the lungs every 4 (four) hours as needed for wheezing or shortness of breath. 04/04/23  Yes Zenia Resides, MD  benzonatate (TESSALON) 100 MG capsule Take 1 capsule (100 mg total) by mouth 3 (three) times daily as needed for cough. 04/04/23  Yes Zenia Resides, MD  doxycycline (VIBRAMYCIN) 100 MG capsule Take 1 capsule (100 mg total) by mouth 2 (two) times daily for 7 days. 04/04/23 04/11/23 Yes Freya Zobrist, Janace Aris, MD  montelukast (SINGULAIR) 10 MG tablet Take 1 tablet (10 mg total) by mouth at bedtime. 04/04/23  Yes Zenia Resides, MD  predniSONE (DELTASONE) 20 MG tablet Take 2 tablets (40 mg total) by mouth daily with breakfast for 5 days. 04/04/23 04/09/23 Yes Zenia Resides, MD  amphetamine-dextroamphetamine (ADDERALL) 15 MG tablet Take 1 tablet by  mouth daily. 03/07/23 03/06/24  Nwoko, Tommas Olp, PA  buPROPion (WELLBUTRIN XL) 300 MG 24 hr tablet Take 1 tablet (300 mg total) by mouth daily. 03/07/23 03/06/24  Nwoko, Tommas Olp, PA  escitalopram (LEXAPRO) 10 MG tablet Take 1 tablet (10 mg total) by mouth daily. 11/07/19 04/14/21  Conan Bowens, MD  sertraline (ZOLOFT) 50 MG tablet Take 1 tablet (50 mg total) by mouth daily. Start half a day for 4 days and then one  A day 09/14/20 12/09/21  [name], MD    Family History Family History  Problem Relation Age of Onset   Lupus Sister    Lupus Other    Hypertension Other    Diabetes Other    Cancer Other     Social History Social History   Tobacco Use   Smoking status: Former   Smokeless tobacco: Never  Vaping Use   Vaping status: Never Used  Substance  Use Topics   Alcohol use: No    Comment: social   Drug use: No     Allergies   Patient has no known allergies.   Review of Systems Review of Systems  Respiratory:  Positive for cough.      Physical Exam Triage Vital Signs ED Triage Vitals  Encounter Vitals Group     BP 04/04/23 1911 116/75     Systolic BP Percentile --      Diastolic BP Percentile --      Pulse Rate 04/04/23 1911 96     Resp 04/04/23 1911 18     Temp 04/04/23 1911 99.7 F (37.6 C)     Temp Source 04/04/23 1911 Oral     SpO2 04/04/23 1911 100 %     Weight --      Height --      Head Circumference --      Peak Flow --      Pain Score 04/04/23 1912 0     Pain Loc --      Pain Education --      Exclude from Growth Chart --    No data found.  Updated Vital Signs BP 116/75 (BP Location: Right Arm)   Pulse 96   Temp 99.7 F (37.6 C) (Oral)   Resp 18   LMP 03/28/2023   SpO2 100%   Breastfeeding No   Visual Acuity Right Eye Distance:   Left Eye Distance:   Bilateral Distance:    Right Eye Near:   Left Eye Near:    Bilateral Near:     Physical Exam Vitals reviewed.  Constitutional:      General: She is not in acute distress.    Appearance: She is not toxic-appearing.  HENT:     Nose: Nose normal.     Mouth/Throat:     Mouth: Mucous membranes are moist.     Pharynx: No oropharyngeal exudate or posterior oropharyngeal erythema.  Eyes:     Extraocular Movements: Extraocular movements intact.     Conjunctiva/sclera: Conjunctivae normal.     Pupils: Pupils are equal, round, and reactive to light.  Cardiovascular:     Rate and Rhythm: Normal rate and regular rhythm.     Heart sounds: No murmur heard. Pulmonary:     Effort: No respiratory distress.     Breath sounds: No stridor. No wheezing, rhonchi or rales.  Musculoskeletal:     Cervical back: Neck supple.  Lymphadenopathy:     Cervical: No cervical adenopathy.  Skin:    Capillary  Refill: Capillary refill takes less than 2  seconds.     Coloration: Skin is not jaundiced or pale.  Neurological:     General: No focal deficit present.     Mental Status: She is alert and oriented to person, place, and time.  Psychiatric:        Behavior: Behavior normal.      UC Treatments / Results  Labs (all labs ordered are listed, but only abnormal results are displayed) Labs Reviewed - No data to display  EKG   Radiology No results found.  Procedures Procedures (including critical care time)  Medications Ordered in UC Medications - No data to display  Initial Impression / Assessment and Plan / UC Course  I have reviewed the triage vital signs and the nursing notes.  Pertinent labs & imaging results that were available during my care of the patient were reviewed by me and considered in my medical decision making (see chart for details).     By my review the chest x-ray shows a left hilar infiltrate.  Doxycycline is sent in to treat pneumonia.  She is advised of radiology over read  Prednisone for 5 days and albuterol inhaler are sent in.  I have asked her to follow-up with her primary care   Final Clinical Impressions(s) / UC Diagnoses   Final diagnoses:  Viral URI with cough  Chronic cough  Mild intermittent asthma with exacerbation  Community acquired pneumonia of left lung, unspecified part of lung     Discharge Instructions      Your chest x-ray shows a pneumonia around your left lung in the middl, by my review.  The radiologist will also read your x-ray, and if their interpretation differs significantly from mine and it would change the treatment of your condition, we will call you.  Take doxycycline 100 mg --1 capsule 2 times daily for 7 days  Take prednisone 20 mg--2 daily for 5 days  Albuterol inhaler--do 2 puffs every 4 hours as needed for shortness of breath or wheezing  Take benzonatate 100 mg, 1 tab every 8 hours as needed for cough.  Montelukast 10 mg--take 1 nightly for  allergies   Please follow-up with your primary care      ED Prescriptions     Medication Sig Dispense Auth. Provider   doxycycline (VIBRAMYCIN) 100 MG capsule Take 1 capsule (100 mg total) by mouth 2 (two) times daily for 7 days. 14 capsule Renaud Celli, Janace Aris, MD   benzonatate (TESSALON) 100 MG capsule Take 1 capsule (100 mg total) by mouth 3 (three) times daily as needed for cough. 21 capsule Zenia Resides, MD   albuterol (VENTOLIN HFA) 108 (90 Base) MCG/ACT inhaler Inhale 2 puffs into the lungs every 4 (four) hours as needed for wheezing or shortness of breath. 1 each Zenia Resides, MD   predniSONE (DELTASONE) 20 MG tablet Take 2 tablets (40 mg total) by mouth daily with breakfast for 5 days. 10 tablet Zenia Resides, MD   montelukast (SINGULAIR) 10 MG tablet Take 1 tablet (10 mg total) by mouth at bedtime. 30 tablet Mohamadou Maciver, Janace Aris, MD      PDMP not reviewed this encounter.   Zenia Resides, MD 04/04/23 2010

## 2023-04-04 NOTE — Progress Notes (Unsigned)
Patient did not connect for virtual psychiatric medication management appointment on 04/06/23 at 1PM. Sent secure video link with no response. Called phone however went straight to VM; unable to leave message as mailbox was full.   Due to number of no shows, patient will need to present as walk-in for medication management.   Daine Gip, MD 04/06/23

## 2023-04-04 NOTE — Discharge Instructions (Addendum)
Your chest x-ray shows a pneumonia around your left lung in the middl, by my review.  The radiologist will also read your x-ray, and if their interpretation differs significantly from mine and it would change the treatment of your condition, we will call you.  Take doxycycline 100 mg --1 capsule 2 times daily for 7 days  Take prednisone 20 mg--2 daily for 5 days  Albuterol inhaler--do 2 puffs every 4 hours as needed for shortness of breath or wheezing  Take benzonatate 100 mg, 1 tab every 8 hours as needed for cough.  Montelukast 10 mg--take 1 nightly for allergies   Please follow-up with your primary care

## 2023-04-05 ENCOUNTER — Encounter (HOSPITAL_COMMUNITY): Payer: Self-pay

## 2023-04-05 ENCOUNTER — Encounter (HOSPITAL_COMMUNITY): Payer: Medicaid Other | Admitting: Psychiatry

## 2023-04-11 ENCOUNTER — Ambulatory Visit (HOSPITAL_COMMUNITY): Payer: Medicaid Other | Admitting: Mental Health

## 2023-04-14 ENCOUNTER — Other Ambulatory Visit (HOSPITAL_COMMUNITY): Payer: Self-pay | Admitting: Physician Assistant

## 2023-04-14 DIAGNOSIS — F331 Major depressive disorder, recurrent, moderate: Secondary | ICD-10-CM

## 2023-04-14 DIAGNOSIS — F900 Attention-deficit hyperactivity disorder, predominantly inattentive type: Secondary | ICD-10-CM

## 2023-05-10 ENCOUNTER — Telehealth (HOSPITAL_COMMUNITY): Payer: Self-pay | Admitting: *Deleted

## 2023-05-10 ENCOUNTER — Telehealth: Payer: Self-pay

## 2023-05-10 ENCOUNTER — Encounter (HOSPITAL_COMMUNITY): Payer: Self-pay | Admitting: Psychiatry

## 2023-05-10 ENCOUNTER — Ambulatory Visit (HOSPITAL_COMMUNITY): Payer: Medicaid Other | Admitting: Psychiatry

## 2023-05-10 ENCOUNTER — Telehealth (HOSPITAL_COMMUNITY): Payer: Self-pay

## 2023-05-10 DIAGNOSIS — F4310 Post-traumatic stress disorder, unspecified: Secondary | ICD-10-CM

## 2023-05-10 DIAGNOSIS — F411 Generalized anxiety disorder: Secondary | ICD-10-CM | POA: Diagnosis not present

## 2023-05-10 DIAGNOSIS — R41840 Attention and concentration deficit: Secondary | ICD-10-CM | POA: Diagnosis not present

## 2023-05-10 DIAGNOSIS — F900 Attention-deficit hyperactivity disorder, predominantly inattentive type: Secondary | ICD-10-CM

## 2023-05-10 DIAGNOSIS — F331 Major depressive disorder, recurrent, moderate: Secondary | ICD-10-CM

## 2023-05-10 MED ORDER — BUPROPION HCL ER (XL) 150 MG PO TB24: 150.0000 mg | ORAL_TABLET | Freq: Every day | ORAL | 0 refills | Status: AC

## 2023-05-10 MED ORDER — ATOMOXETINE HCL 40 MG PO CAPS: 40.0000 mg | ORAL_CAPSULE | Freq: Every day | ORAL | 1 refills | Status: AC

## 2023-05-10 NOTE — Patient Instructions (Signed)
 Thank you for attending your appointment today.  -- START Strattera  40 mg daily; please see attached for additional information about this medication -- DECREASE Wellbutrin  XL to 150 mg daily for 2 weeks then STOP -- As discussed, we are not continuing your Adderall -- When you establish with primary care, please discuss with them safety of using stimulants given your seizure history and consider referral to neurology if necessary  Please do not make any changes to medications without first discussing with your provider. If you are experiencing a psychiatric emergency, please call 911 or present to your nearest emergency department. Additional crisis, medication management, and therapy resources are included below.  [facility]  [address], [place], [place] [address] [phone] WALK-IN URGENT CARE 24/7 FOR ANYONE [address], [place], [place]  [phone] Fax: [phone] [email or web address] *Interpreters available *Accepts all insurance and uninsured for Urgent Care needs *Accepts Medicaid and uninsured for outpatient treatment (below)      ONLY FOR [facility]  Below:    Outpatient New Patient Assessment/Therapy Walk-ins:        Monday, Wednesday, and Thursday 8am until slots are full (first come, first served)                   New Patient Psychiatry/Medication Management        Monday-Friday 8am-11am (first come, first served)               For all walk-ins we ask that you arrive by 7:15am, because patients will be seen in the order of arrival.
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
## 2023-05-10 NOTE — Telephone Encounter (Signed)
 Patient called wanting to make an appointment, I explained to patient that she has to come in as a walk in due to the excessive amount of No shows she has had within the past couple of months. 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 I explained to her that we have a N/S policy that we have to follow . Pt states sounds like I am being denied I told patient  you are not being denied, we are telling you that we have a N/S policy that we have to follow Patient continued to say she is being denied very angry. I explained to her our walk in hours & she told me again she can never come here as a walk in very disrespectful. I explained to her again our policy & told her to have a nice day. Phone call ended. Thanks

## 2023-05-10 NOTE — Telephone Encounter (Signed)
 Medication management - Call from patient regarding her stated inability to come in as a walk-in patient and request to be given another appointment. Discussed with patient her 8 no shows over the past 23 months and need to keep appointments. 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  Explained this was understandable but that patient had the ability to call 24 hours in advance to just cancel and reschedule any appointments she could not make. Informed patient that each time she misses an appointment, it takes an appointment away from someone else that could have been seen and that our providers are booked out 1-2 months.  Informed also, that it was unlikely our providers would keep her on a controlled substance and to keep filling these without being seen.  Agreed to assist patient with making one more appointment but that if she no showed or did not call to reschedule, she would have to begin being seen as a walk-in patient so we do not tie up appointments that she may not attend.  Patient stated understanding these reasons and agreed to work on keeping all scheduled appointments.  Then met with patient access staff as Dr. Mercy has a cancellation at 10:00 am today for a new patient.  Called patient back to schedule this as a virtual visit and she requested to do the appointment.  Spoke with Dr. Mercy as patient wanted the change to a female psychiatrist and she agreed to see patient today for her first evaluation.  Dr. Mercy agreed to let this nurse manager know if any issues with assessment with patient and will refer out to other resources if patient does not agree with her treatment.

## 2023-05-10 NOTE — Telephone Encounter (Signed)
 Patient called asking for refills of medication (Adderall and Wellbutrin ). Chart reviewed, multiple no shows with the last on 12/4. Explained to patient that she must be seen by a provider to continue to receive refills of medication. Gave her the times of the walk in clinic. 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 Explained that she could make an appointment to be seen by a provider if she would like further refills. Transferred patient to front desk to make an appointment.

## 2023-05-10 NOTE — Telephone Encounter (Signed)
 PT called asking why her

## 2023-05-24 ENCOUNTER — Ambulatory Visit (HOSPITAL_COMMUNITY): Payer: Medicaid Other | Admitting: Mental Health

## 2023-05-24 ENCOUNTER — Encounter (HOSPITAL_COMMUNITY): Payer: Self-pay

## 2023-05-24 ENCOUNTER — Other Ambulatory Visit (HOSPITAL_COMMUNITY): Payer: Medicaid Other

## 2023-06-15 NOTE — Progress Notes (Unsigned)
Patient did not connect for virtual psychiatric medication management appointment on 06/16/23 at 9AM. Sent secure video link with no response. Called mobile phone and received out of service message; called work phone with no answer and left VM with callback number to reschedule.  Patient has had numerous no shows in this clinic (see documentation from 05/10/23). At that time, clinic manager Everlene Balls RN discussed with patient clinic policy for attendance as well as the following plan moving forward:  "Agreed to assist patient with making one more appointment but that if she no showed or did not call to reschedule, she would have to begin being seen as a walk-in patient so we do not tie up appointments that she may not attend. Patient stated understanding these reasons and agreed to work on keeping all scheduled appointments."  She was scheduled for same day appointment with me on 05/10/23 due to cancellation and attended that appointment. She was scheduled today for follow-up appointment however did not log in to video platform. Numerous attempts were made to reach patient by phone without answer. Given numerous no shows and multiple chances to comply with clinic policy, will require that patient present as walk-in if she desires medication management or therapy services in the future. Patient currently has initial therapy appointment scheduled in April, however this will be canceled and patient notified of requirement to come as walk in until she can re-establish care. Front desk will reach out to patient to review.  Daine Gip, MD 06/16/23

## 2023-06-16 ENCOUNTER — Encounter (HOSPITAL_COMMUNITY): Payer: Self-pay

## 2023-06-16 ENCOUNTER — Encounter (HOSPITAL_COMMUNITY): Payer: Medicaid Other | Admitting: Psychiatry

## 2023-08-04 ENCOUNTER — Ambulatory Visit (HOSPITAL_COMMUNITY): Payer: Medicaid Other | Admitting: Mental Health

## 2023-11-07 ENCOUNTER — Emergency Department (HOSPITAL_COMMUNITY): Admission: EM | Admit: 2023-11-07 | Discharge: 2023-11-07 | Discharge status: Against Medical Advice

## 2023-11-07 NOTE — ED Notes (Signed)
 PT decided to leave without being seen stated it was taking too long to go back

## 2024-02-26 ENCOUNTER — Emergency Department (HOSPITAL_BASED_OUTPATIENT_CLINIC_OR_DEPARTMENT_OTHER)
Admission: EM | Admit: 2024-02-26 | Discharge: 2024-02-26 | Disposition: A | Discharge status: Home / Self Care | Attending: Emergency Medicine | Admitting: Emergency Medicine

## 2024-02-26 ENCOUNTER — Encounter (HOSPITAL_BASED_OUTPATIENT_CLINIC_OR_DEPARTMENT_OTHER): Payer: Self-pay | Admitting: Emergency Medicine

## 2024-02-26 ENCOUNTER — Other Ambulatory Visit: Payer: Self-pay

## 2024-02-26 DIAGNOSIS — B9689 Other specified bacterial agents as the cause of diseases classified elsewhere: Secondary | ICD-10-CM

## 2024-02-26 DIAGNOSIS — R110 Nausea: Secondary | ICD-10-CM | POA: Diagnosis not present

## 2024-02-26 DIAGNOSIS — R1024 Suprapubic pain: Secondary | ICD-10-CM | POA: Insufficient documentation

## 2024-02-26 LAB — COMPREHENSIVE METABOLIC PANEL WITH GFR
ALT: 20 U/L (ref 0–44)
AST: 21 U/L (ref 15–41)
Albumin: 4.6 g/dL (ref 3.5–5.0)
Alkaline Phosphatase: 77 U/L (ref 38–126)
Anion gap: 11 (ref 5–15)
BUN: 11 mg/dL (ref 6–20)
CO2: 23 mmol/L (ref 22–32)
Calcium: 9.8 mg/dL (ref 8.9–10.3)
Chloride: 106 mmol/L (ref 98–111)
Creatinine, Ser: 0.86 mg/dL (ref 0.44–1.00)
GFR, Estimated: >60 mL/min (ref >60)
Glucose, Bld: 81 mg/dL (ref 70–99)
Potassium: 3.7 mmol/L (ref 3.5–5.1)
Sodium: 140 mmol/L (ref 135–145)
Total Bilirubin: 0.5 mg/dL (ref 0.0–1.2)
Total Protein: 7.7 g/dL (ref 6.5–8.1)

## 2024-02-26 LAB — WET PREP, GENITAL
Clue Cells Wet Prep HPF POC: POSITIVE — AB
Sperm: NONE SEEN
Trich, Wet Prep: NONE SEEN
WBC, Wet Prep HPF POC: <10 (ref <10)
Yeast Wet Prep HPF POC: NONE SEEN

## 2024-02-26 LAB — PREGNANCY, URINE: Preg Test, Ur: NEGATIVE

## 2024-02-26 LAB — URINALYSIS, ROUTINE W REFLEX MICROSCOPIC
Bilirubin Urine: NEGATIVE
Glucose, UA: NEGATIVE mg/dL
Ketones, ur: NEGATIVE mg/dL
Leukocytes,Ua: NEGATIVE
Nitrite: NEGATIVE
Protein, ur: 30 mg/dL — AB
Specific Gravity, Urine: >=1.03 (ref 1.005–1.030)
pH: 5.5 (ref 5.0–8.0)

## 2024-02-26 LAB — CBC WITH DIFFERENTIAL/PLATELET
Abs Immature Granulocytes: 0.02 K/uL (ref 0.00–0.07)
Basophils Absolute: 0.1 K/uL (ref 0.0–0.1)
Basophils Relative: 1 %
Eosinophils Absolute: 0 K/uL (ref 0.0–0.5)
Eosinophils Relative: 0 %
HCT: 38.4 % (ref 36.0–46.0)
Hemoglobin: 12.4 g/dL (ref 12.0–15.0)
Immature Granulocytes: 0 %
Lymphocytes Relative: 27 %
Lymphs Abs: 2.8 K/uL (ref 0.7–4.0)
MCH: 26.8 pg (ref 26.0–34.0)
MCHC: 32.3 g/dL (ref 30.0–36.0)
MCV: 82.9 fL (ref 80.0–100.0)
Monocytes Absolute: 0.9 K/uL (ref 0.1–1.0)
Monocytes Relative: 9 %
Neutro Abs: 6.6 K/uL (ref 1.7–7.7)
Neutrophils Relative %: 63 %
Platelets: 461 K/uL — ABNORMAL HIGH (ref 150–400)
RBC: 4.63 MIL/uL (ref 3.87–5.11)
RDW: 13.4 % (ref 11.5–15.5)
WBC: 10.3 K/uL (ref 4.0–10.5)
nRBC: 0 % (ref 0.0–0.2)

## 2024-02-26 LAB — LIPASE, BLOOD: Lipase: 22 U/L (ref 11–51)

## 2024-02-26 LAB — URINALYSIS, MICROSCOPIC (REFLEX)

## 2024-02-26 LAB — HIV ANTIBODY (ROUTINE TESTING W REFLEX): HIV Screen 4th Generation wRfx: NONREACTIVE

## 2024-02-26 MED ORDER — METRONIDAZOLE 500 MG PO TABS: 500.0000 mg | ORAL_TABLET | Freq: Two times a day (BID) | ORAL | 0 refills | Status: AC

## 2024-02-26 NOTE — ED Notes (Signed)
 EDP notified of urine culture add-on.

## 2024-02-26 NOTE — ED Notes (Signed)
 ED Provider at bedside.

## 2024-02-26 NOTE — ED Provider Notes (Signed)
 [place] [facility] Provider Note   CSN: [id number] Arrival date & time: 02/26/24  1141     Patient presents with: Groin Pain   Teresa Reed is a 33 y.o. female otherwise healthy presents with complaints of suprapubic abdominal pain over the past week.  Associated with some nausea, no vomiting, diarrhea.  Denies any urinary or vaginal symptoms.  Is requesting STI treatment.    Groin Pain Associated symptoms include abdominal pain.   Past Medical History:  Diagnosis Date   Anxiety    BV (bacterial vaginosis)    Depression    History of ectopic pregnancy 04/17/2018   G2, 2009   Seizures (HCC)    UTI (lower urinary tract infection)    Past Surgical History:  Procedure Laterality Date   CESAREAN SECTION     CESAREAN SECTION WITH BILATERAL TUBAL LIGATION Bilateral 10/25/2019   Procedure: CESAREAN SECTION WITH BILATERAL TUBAL LIGATION;  Surgeon: Herchel Gloris LABOR, MD;  Location: [facility];  Service: Obstetrics;  Laterality: Bilateral;   DILATION AND CURETTAGE, DIAGNOSTIC / THERAPEUTIC     TUMOR REMOVAL     tumor removed          Prior to Admission medications   Medication Sig Start Date End Date Taking? Authorizing Provider  albuterol  (VENTOLIN  HFA) 108 (90 Base) MCG/ACT inhaler Inhale 2 puffs into the lungs every 4 (four) hours as needed for wheezing or shortness of breath. Patient not taking: Reported on 05/10/2023 04/04/23   Vonna Sharlet POUR, MD  atomoxetine  (STRATTERA ) 40 MG capsule Take 1 capsule (40 mg total) by mouth daily. 05/10/23 07/09/23  Bahraini, Sarah A  benzonatate  (TESSALON ) 100 MG capsule Take 1 capsule (100 mg total) by mouth 3 (three) times daily as needed for cough. Patient not taking: Reported on 05/10/2023 04/04/23   Vonna Sharlet POUR, MD  buPROPion  (WELLBUTRIN  XL) 150 MG 24 hr tablet Take 1 tablet (150 mg total) by mouth daily for 14 days. Then stop. 05/10/23 05/24/23  Bahraini, Sarah A  montelukast  (SINGULAIR ) 10 MG  tablet Take 1 tablet (10 mg total) by mouth at bedtime. Patient not taking: Reported on 05/10/2023 04/04/23   Banister, Pamela K, MD  Multiple Vitamin (MULTIVITAMIN WITH MINERALS) TABS tablet Take 1 tablet by mouth daily.    [provider]  escitalopram  (LEXAPRO ) 10 MG tablet Take 1 tablet (10 mg total) by mouth daily. 11/07/19 04/14/21  Nicholaus Burnard HERO, MD  sertraline  (ZOLOFT ) 50 MG tablet Take 1 tablet (50 mg total) by mouth daily. Start half a day for 4 days and then one  A day 09/14/20 12/09/21  Geralene Kaiser, MD    Allergies: Patient has no known allergies.    Review of Systems  Gastrointestinal:  Positive for abdominal pain.    Updated Vital Signs BP 137/79   Pulse 74   Temp 98.4 F (36.9 C)   Resp 20   Wt 67.1 kg   LMP 02/13/2024 (Exact Date)   SpO2 95%   BMI 26.22 kg/m   Physical Exam Vitals and nursing note reviewed.  Constitutional:      General: She is not in acute distress.    Appearance: She is well-developed.  HENT:     Head: Normocephalic and atraumatic.  Eyes:     Conjunctiva/sclera: Conjunctivae normal.  Cardiovascular:     Rate and Rhythm: Normal rate and regular rhythm.     Heart sounds: No murmur heard. Pulmonary:     Effort: Pulmonary effort  is normal. No respiratory distress.     Breath sounds: Normal breath sounds.  Abdominal:     Palpations: Abdomen is soft.     Tenderness: There is abdominal tenderness.     Comments: Suprapubic abdominal tenderness, soft nondistended  Musculoskeletal:        General: No swelling.     Cervical back: Neck supple.  Skin:    General: Skin is warm and dry.     Capillary Refill: Capillary refill takes less than 2 seconds.  Neurological:     Mental Status: She is alert.  Psychiatric:        Mood and Affect: Mood normal.     (all labs ordered are listed, but only abnormal results are displayed) Labs Reviewed  URINALYSIS, ROUTINE W REFLEX MICROSCOPIC  PREGNANCY, URINE    EKG: None  Radiology: No  results found.   Procedures   Medications Ordered in the ED - No data to display  Clinical Course as of 02/26/24 1508  Mon Feb 26, 2024  1341 Patient evaluated for suprapubic abdominal pain over the past week that is associated with nausea without vomiting, diarrhea, urinary or vaginal symptoms.  She is hemodynamically stable.  She is requesting STI testing.  Will obtain abdominal labs and add on STI panel. [JT]  1409 Urinalysis, Routine w reflex microscopic -Urine, Clean Catch(!) Negative nitrites, negative leukocytes however many bacteria [JT]  1425 CBC with Differential(!) No significant abnormality [JT]  1425 Wet prep, genital(!) Positive BV [JT]  1431 Pregnancy, urine Negative [JT]  1506 Comprehensive metabolic panel Unremarkable [JT]  1506 Lipase, blood Within normal limit [JT]  1506 Workup overall notable for BV.  Remainder of STI panel is still pending.  Prescription for metronidazole  sent into the pharmacy.  UA was with many bacteria however 11-20 squamous cells and negative nitrites and negative leukocytes 0-5 WBCs.  Will hold off on empiric treatment sending urine culture. [JT]    Clinical Course User Index [JT] Donnajean Lynwood DEL, PA-C                                 Medical Decision Making Amount and/or Complexity of Data Reviewed Labs: ordered. Decision-making details documented in ED Course.  Risk Prescription drug management.   This patient presents to the ED with chief complaint(s) of abdominal pain.  The complaint involves an extensive differential diagnosis and also carries with it a high risk of complications and morbidity.   Pertinent past medical history as listed in HPI  The differential diagnosis includes  Low suspicion for nephrolithiasis, pyelonephritis based off exam and lab work.  Do not suspect PID.  UA is not consistent with UTI.  Additional history obtained: Records reviewed Care Everywhere/External Records  Disposition:   Patient will be  discharged home. The patient has been appropriately medically screened and/or stabilized in the ED. I have low suspicion for any other emergent medical condition which would require further screening, evaluation or treatment in the ED or require inpatient management. At time of discharge the patient is hemodynamically stable and in no acute distress. I have discussed work-up results and diagnosis with patient and answered all questions. Patient is agreeable with discharge plan. We discussed strict return precautions for returning to the emergency department and they verbalized understanding.     Social Determinants of Health:   none  This note was dictated with voice recognition software.  Despite best efforts at proofreading, errors  may have occurred which can change the documentation meaning.       Final diagnoses:  None    ED Discharge Orders     None          Donnajean Lynwood VEAR DEVONNA 02/26/24 1509    Dreama Longs, MD 02/26/24 2207

## 2024-02-26 NOTE — Discharge Instructions (Addendum)
 You were evaluated in the emergency room for abdominal pain.  Your lab work was consistent with BV.  You were given antibiotics for this.  Please keep an eye on your MyChart for the manger of your results.  A prescription for Flagyl  was sent to your pharmacy.  Please be sure to complete the full course.  If you experience worsening symptoms please return to the emergency room.

## 2024-02-26 NOTE — ED Triage Notes (Incomplete)
 Left groin pain radiating to inner vagina . Requesting Herpes testing since partner tested positive for years yet no outbreak .  Unprotected intercourse x 1 month  she said .

## 2024-02-26 NOTE — ED Triage Notes (Addendum)
 Pt reporting pelvic pain x few days. Pt reporting urinary frequency, denying any burning symptoms.   Pt stated she found out from her significant other today that he has genital herpes, has been getting treatment since age 33.

## 2024-02-27 LAB — GC/CHLAMYDIA PROBE AMP (~~LOC~~) NOT AT ARMC
Chlamydia: NEGATIVE
Comment: NEGATIVE
Comment: NORMAL
Neisseria Gonorrhea: NEGATIVE

## 2024-02-27 LAB — URINE CULTURE
